# Patient Record
Sex: Female | Born: 1960
Health system: Southern US, Community
[De-identification: ages and names within clinical notes are randomized; demographics above are authoritative.]

## PROBLEM LIST (undated history)

## (undated) DIAGNOSIS — Z789 Other specified health status: Secondary | ICD-10-CM

## (undated) HISTORY — DX: Other specified health status: Z78.9

---

## 2004-09-11 ENCOUNTER — Ambulatory Visit: Payer: Self-pay | Admitting: General Surgery

## 2004-09-22 ENCOUNTER — Ambulatory Visit: Payer: Self-pay | Admitting: General Surgery

## 2004-11-18 ENCOUNTER — Ambulatory Visit: Payer: Self-pay | Admitting: General Practice

## 2005-11-17 ENCOUNTER — Ambulatory Visit: Payer: Self-pay | Admitting: General Practice

## 2006-11-23 ENCOUNTER — Ambulatory Visit: Payer: Self-pay | Admitting: General Practice

## 2006-11-26 ENCOUNTER — Ambulatory Visit: Payer: Self-pay | Admitting: General Practice

## 2007-07-04 ENCOUNTER — Ambulatory Visit: Payer: Self-pay | Admitting: General Surgery

## 2008-01-13 ENCOUNTER — Ambulatory Visit: Payer: Self-pay | Admitting: General Surgery

## 2008-12-12 ENCOUNTER — Ambulatory Visit: Payer: Self-pay | Admitting: General Practice

## 2009-12-17 ENCOUNTER — Ambulatory Visit: Payer: Self-pay | Admitting: General Surgery

## 2010-12-24 ENCOUNTER — Ambulatory Visit: Payer: Self-pay | Admitting: Unknown Physician Specialty

## 2011-12-30 ENCOUNTER — Ambulatory Visit: Payer: Self-pay | Admitting: Unknown Physician Specialty

## 2012-01-15 ENCOUNTER — Ambulatory Visit: Payer: Self-pay | Admitting: Unknown Physician Specialty

## 2013-01-03 ENCOUNTER — Ambulatory Visit: Payer: Self-pay | Admitting: General Practice

## 2013-07-13 ENCOUNTER — Ambulatory Visit: Payer: Self-pay | Admitting: Specialist

## 2014-01-10 ENCOUNTER — Ambulatory Visit: Payer: Self-pay | Admitting: Unknown Physician Specialty

## 2014-03-13 ENCOUNTER — Ambulatory Visit: Payer: Self-pay | Admitting: General Practice

## 2014-03-26 HISTORY — PX: SHOULDER ARTHROSCOPY WITH ROTATOR CUFF REPAIR: SHX5685

## 2015-01-15 ENCOUNTER — Ambulatory Visit: Payer: Self-pay | Admitting: General Practice

## 2015-07-02 ENCOUNTER — Other Ambulatory Visit: Payer: Self-pay | Admitting: Physician Assistant

## 2015-10-21 ENCOUNTER — Other Ambulatory Visit: Payer: Self-pay | Admitting: Physician Assistant

## 2015-10-22 ENCOUNTER — Other Ambulatory Visit: Payer: Self-pay | Admitting: Physician Assistant

## 2016-01-01 ENCOUNTER — Other Ambulatory Visit: Payer: Self-pay | Admitting: Family Medicine

## 2016-01-01 ENCOUNTER — Other Ambulatory Visit: Payer: Self-pay | Admitting: Unknown Physician Specialty

## 2016-01-01 DIAGNOSIS — Z1231 Encounter for screening mammogram for malignant neoplasm of breast: Secondary | ICD-10-CM

## 2016-01-22 ENCOUNTER — Ambulatory Visit
Admission: RE | Admit: 2016-01-22 | Discharge: 2016-01-22 | Disposition: A | Discharge status: Home / Self Care | Payer: BLUE CROSS/BLUE SHIELD | Source: Ambulatory Visit | Attending: Family Medicine | Admitting: Family Medicine

## 2016-01-22 DIAGNOSIS — R928 Other abnormal and inconclusive findings on diagnostic imaging of breast: Secondary | ICD-10-CM | POA: Diagnosis not present

## 2016-01-22 DIAGNOSIS — Z1231 Encounter for screening mammogram for malignant neoplasm of breast: Secondary | ICD-10-CM

## 2016-01-27 ENCOUNTER — Other Ambulatory Visit: Payer: Self-pay | Admitting: Family Medicine

## 2016-01-27 DIAGNOSIS — N63 Unspecified lump in unspecified breast: Secondary | ICD-10-CM

## 2016-01-31 ENCOUNTER — Ambulatory Visit
Admission: RE | Admit: 2016-01-31 | Discharge: 2016-01-31 | Disposition: A | Discharge status: Home / Self Care | Payer: BLUE CROSS/BLUE SHIELD | Source: Ambulatory Visit | Attending: Family Medicine | Admitting: Family Medicine

## 2016-01-31 DIAGNOSIS — N63 Unspecified lump in unspecified breast: Secondary | ICD-10-CM

## 2016-01-31 DIAGNOSIS — N6001 Solitary cyst of right breast: Secondary | ICD-10-CM | POA: Diagnosis not present

## 2016-06-30 ENCOUNTER — Emergency Department: Payer: BLUE CROSS/BLUE SHIELD

## 2016-06-30 DIAGNOSIS — R0781 Pleurodynia: Secondary | ICD-10-CM | POA: Diagnosis not present

## 2016-06-30 DIAGNOSIS — S20212A Contusion of left front wall of thorax, initial encounter: Secondary | ICD-10-CM | POA: Diagnosis not present

## 2016-06-30 DIAGNOSIS — Y92002 Bathroom of unspecified non-institutional (private) residence single-family (private) house as the place of occurrence of the external cause: Secondary | ICD-10-CM | POA: Diagnosis not present

## 2016-06-30 DIAGNOSIS — Y999 Unspecified external cause status: Secondary | ICD-10-CM | POA: Diagnosis not present

## 2016-06-30 DIAGNOSIS — Y939 Activity, unspecified: Secondary | ICD-10-CM | POA: Insufficient documentation

## 2016-06-30 DIAGNOSIS — S299XXA Unspecified injury of thorax, initial encounter: Secondary | ICD-10-CM | POA: Diagnosis not present

## 2016-06-30 DIAGNOSIS — W01198A Fall on same level from slipping, tripping and stumbling with subsequent striking against other object, initial encounter: Secondary | ICD-10-CM | POA: Insufficient documentation

## 2016-06-30 NOTE — ED Triage Notes (Signed)
Pt to triage via w/c with no distress noted; reports falling in bathtub PTA, hitting left side ribcage; st pain increases with deep breathing; denies any other c/o or injuries

## 2016-07-01 ENCOUNTER — Emergency Department
Admission: EM | Admit: 2016-07-01 | Discharge: 2016-07-01 | Disposition: A | Discharge status: Home / Self Care | Payer: BLUE CROSS/BLUE SHIELD | Attending: Emergency Medicine | Admitting: Emergency Medicine

## 2016-07-01 DIAGNOSIS — S20212A Contusion of left front wall of thorax, initial encounter: Secondary | ICD-10-CM

## 2016-07-01 DIAGNOSIS — W19XXXA Unspecified fall, initial encounter: Secondary | ICD-10-CM

## 2016-07-01 MED ORDER — KETOROLAC TROMETHAMINE 30 MG/ML IJ SOLN
60.0000 mg | Freq: Once | INTRAMUSCULAR | Status: AC
  Administered 2016-07-01: 60 mg via INTRAMUSCULAR
  Filled 2016-07-01: qty 2

## 2016-07-01 MED ORDER — IBUPROFEN 600 MG PO TABS: 600.0000 mg | ORAL_TABLET | Freq: Three times a day (TID) | ORAL | 0 refills | Status: DC | PRN

## 2016-07-01 MED ORDER — OXYCODONE-ACETAMINOPHEN 5-325 MG PO TABS
1.0000 | ORAL_TABLET | Freq: Once | ORAL | Status: AC
  Administered 2016-07-01: 1 via ORAL
  Filled 2016-07-01: qty 1

## 2016-07-01 MED ORDER — ONDANSETRON 4 MG PO TBDP
4.0000 mg | ORAL_TABLET | Freq: Once | ORAL | Status: AC
  Administered 2016-07-01: 4 mg via ORAL
  Filled 2016-07-01: qty 1

## 2016-07-01 MED ORDER — OXYCODONE-ACETAMINOPHEN 5-325 MG PO TABS
1.0000 | ORAL_TABLET | ORAL | 0 refills | Status: DC | PRN
Start: 2016-07-01 — End: 2017-05-20

## 2016-07-01 NOTE — Discharge Instructions (Signed)
1. You may take pain medicines as needed (Motrin/Percocet). 2. Apply ice to affected area several times daily. 3. Use incentive spirometer as instructed. 4. Return to the ER for worsening symptoms, persistent vomiting, fever, difficulty breathing or other concerns.

## 2016-07-01 NOTE — ED Provider Notes (Signed)
[facility] Emergency Department Provider Note   ____________________________________________   First MD Initiated Contact with Patient 07/01/16 [phone]     (approximate)  I have reviewed the triage vital signs and the nursing notes.   HISTORY  Chief Complaint Fall    HPI Jenna Ross is a 55 y.o. female who presents to the ED from home with a chief complaint of fall with left rib pain. Patient tripped on her bath mat and fell, striking her posterior left ribs into bathtub. States she spun around and struck her shin. Denies striking head or LOC. Denies associated neck pain, headache, vision changes, shortness of breath, abdominal pain, hematuria, nausea, vomiting, diarrhea. Reports it hurts to take a deep breath. Nothing makes her symptoms better.   Past medical history None  There are no active problems to display for this patient.   No past surgical history on file.  Prior to Admission medications   Medication Sig Start Date End Date Taking? Authorizing Provider  ibuprofen (ADVIL,MOTRIN) 600 MG tablet Take 1 tablet (600 mg total) by mouth every 8 (eight) hours as needed. 07/01/16   Paulette Blanch, MD  oxyCODONE-acetaminophen (ROXICET) 5-325 MG tablet Take 1 tablet by mouth every 4 (four) hours as needed for severe pain. 07/01/16   Paulette Blanch, MD    Allergies Review of patient's allergies indicates no known allergies.  No family history on file.  Social History Social History  Substance Use Topics  . Smoking status: Not on file  . Smokeless tobacco: Not on file  . Alcohol use Not on file    Review of Systems  Constitutional: No fever/chills. Eyes: No visual changes. ENT: No sore throat. Cardiovascular: Positive for chest pain. Respiratory: Positive for pain on inspiration. Denies shortness of breath. Gastrointestinal: No abdominal pain.  No nausea, no vomiting.  No diarrhea.  No constipation. Genitourinary: Negative for  dysuria. Musculoskeletal: Negative for back pain. Skin: Negative for rash. Neurological: Negative for headaches, focal weakness or numbness.  10-point ROS otherwise negative.  ____________________________________________   PHYSICAL EXAM:  VITAL SIGNS: ED Triage Vitals  Enc Vitals Group     BP 07/01/16 0043 135/72     Pulse Rate 07/01/16 0043 67     Resp 07/01/16 0043 18     Temp 07/01/16 0043 97.8 F (36.6 C)     Temp Source 07/01/16 0043 Oral     SpO2 07/01/16 0043 100 %     Weight 06/30/16 2337 140 lb (63.5 kg)     Height 06/30/16 2337 5\' 1"  (1.549 m)     Head Circumference --      Peak Flow --      Pain Score 06/30/16 2338 10     Pain Loc --      Pain Edu? --      Excl. in [place]? --     Constitutional: Alert and oriented. Well appearing and in mild acute distress. Eyes: Conjunctivae are normal. PERRL. EOMI. Head: Atraumatic. Nose: No congestion/rhinnorhea. Mouth/Throat: Mucous membranes are moist.  Oropharynx non-erythematous. Neck: No stridor.  No cervical spine tenderness to palpation. Cardiovascular: Normal rate, regular rhythm. Grossly normal heart sounds.  Good peripheral circulation. Respiratory: Normal respiratory effort.  No retractions. Splinting. Lungs CTAB. Left lateral/posterior lower ribs with ecchymosis and tender to palpation. No crepitus. Gastrointestinal: Soft and nontender to light or deep palpation, especially in the left upper quadrant.. No distention. No abdominal bruits. No CVA tenderness. Musculoskeletal: No lower extremity tenderness nor edema.  No joint effusions. Neurologic:  Normal speech and language. No gross focal neurologic deficits are appreciated. No gait instability. Skin:  Skin is warm, dry and intact. No rash noted. Psychiatric: Mood and affect are normal. Speech and behavior are normal.  ____________________________________________   LABS (all labs ordered are listed, but only abnormal results are displayed)  Labs Reviewed - No  data to display ____________________________________________  EKG  None ____________________________________________  RADIOLOGY  Chest 2 view (viewed by me, interpreted per Dr. Quintella Reichert): No active cardiopulmonary disease. ____________________________________________   PROCEDURES  Procedure(s) performed: None  Procedures  Critical Care performed: No  ____________________________________________   INITIAL IMPRESSION / ASSESSMENT AND PLAN / ED COURSE  Pertinent labs & imaging results that were available during my care of the patient were reviewed by me and considered in my medical decision making (see chart for details).  55 year old female who presents status post fall with left rib contusion. Patient is not tachypneic, room air saturations 100%. Will treat with NSAID, analgesia, incentive spirometer with close follow-up with patient's PCP. Strict return precautions given. Patient and spouse verbalize understanding and agree with plan of care.  Clinical Course     ____________________________________________   FINAL CLINICAL IMPRESSION(S) / ED DIAGNOSES  Final diagnoses:  Fall, initial encounter  Chest wall contusion, left, initial encounter  Bruised rib, left, initial encounter      NEW MEDICATIONS STARTED DURING THIS VISIT:  Discharge Medication List as of 07/01/2016  3:23 AM    START taking these medications   Details  ibuprofen (ADVIL,MOTRIN) 600 MG tablet Take 1 tablet (600 mg total) by mouth every 8 (eight) hours as needed., Starting Wed 07/01/2016, Print    oxyCODONE-acetaminophen (ROXICET) 5-325 MG tablet Take 1 tablet by mouth every 4 (four) hours as needed for severe pain., Starting Wed 07/01/2016, Print         Note:  This document was prepared using Dragon voice recognition software and may include unintentional dictation errors.    Paulette Blanch, MD 07/01/16 [phone]

## 2016-10-26 HISTORY — PX: BREAST CYST ASPIRATION: SHX578

## 2017-02-01 ENCOUNTER — Other Ambulatory Visit: Payer: Self-pay | Admitting: Family Medicine

## 2017-02-01 DIAGNOSIS — Z1231 Encounter for screening mammogram for malignant neoplasm of breast: Secondary | ICD-10-CM

## 2017-02-26 ENCOUNTER — Encounter: Payer: Self-pay | Admitting: Radiology

## 2017-02-26 ENCOUNTER — Ambulatory Visit
Admission: RE | Admit: 2017-02-26 | Discharge: 2017-02-26 | Disposition: A | Discharge status: Home / Self Care | Payer: BLUE CROSS/BLUE SHIELD | Source: Ambulatory Visit | Attending: Family Medicine | Admitting: Family Medicine

## 2017-02-26 DIAGNOSIS — Z1231 Encounter for screening mammogram for malignant neoplasm of breast: Secondary | ICD-10-CM | POA: Diagnosis not present

## 2017-03-23 ENCOUNTER — Encounter: Payer: Self-pay | Admitting: *Deleted

## 2017-03-31 ENCOUNTER — Encounter: Payer: Self-pay | Admitting: *Deleted

## 2017-04-07 ENCOUNTER — Ambulatory Visit (INDEPENDENT_AMBULATORY_CARE_PROVIDER_SITE_OTHER): Payer: BLUE CROSS/BLUE SHIELD | Admitting: General Surgery

## 2017-04-07 ENCOUNTER — Encounter: Payer: Self-pay | Admitting: General Surgery

## 2017-04-07 ENCOUNTER — Inpatient Hospital Stay: Payer: Self-pay

## 2017-04-07 VITALS — BP 124/72 | HR 70 | Resp 12 | Ht 62.0 in | Wt 143.0 lb

## 2017-04-07 DIAGNOSIS — N6001 Solitary cyst of right breast: Secondary | ICD-10-CM | POA: Diagnosis not present

## 2017-04-07 DIAGNOSIS — N6310 Unspecified lump in the right breast, unspecified quadrant: Secondary | ICD-10-CM

## 2017-04-07 NOTE — Progress Notes (Signed)
Patient ID: Jenna Ross, female   DOB: 08-Mar-1961, 56 y.o.   MRN: [id number]  Chief Complaint  Patient presents with  . Breast Mass    Right    HPI Jenna Ross is a 56 y.o. female who presents for a breast evaluation. The most recent mammogram was done on 03/01/2017. Patient noticed a lump in her right breast about two moths ago. The states the area was the size of a egg. A sharp pain come and go. Patient states the area has got smaller. She has been having Irregular periods and lots of hot flashes.  Patient does perform regular self breast checks and gets regular mammograms done.    HPI  No past medical history on file.  Past Surgical History:  Procedure Laterality Date  . CESAREAN SECTION  1986  . SHOULDER ARTHROSCOPY WITH ROTATOR CUFF REPAIR  03/2014    Family History  Problem Relation Age of Onset  . Breast cancer Neg Hx     Social History Social History  Substance Use Topics  . Smoking status: Former Smoker    Packs/day: 1.00    Years: 30.00  . Smokeless tobacco: Never Used  . Alcohol use No    No Known Allergies  Current Outpatient Prescriptions  Medication Sig Dispense Refill  . danazol (DANOCRINE) 100 MG capsule TAKE ONE CAPSULE BY MOUTH TWICE A DAY FOR 1 WEEK PER MONTH  3  . hydrOXYzine (ATARAX/VISTARIL) 25 MG tablet Take 25 mg by mouth every 8 (eight) hours as needed.  2  . ibuprofen (ADVIL,MOTRIN) 600 MG tablet Take 1 tablet (600 mg total) by mouth every 8 (eight) hours as needed. 15 tablet 0  . oxyCODONE-acetaminophen (ROXICET) 5-325 MG tablet Take 1 tablet by mouth every 4 (four) hours as needed for severe pain. (Patient taking differently: Take 1 tablet by mouth 3 times/day as needed-between meals & bedtime for severe pain. ) 20 tablet 0  . Phendimetrazine Tartrate 35 MG TABS TAKE 1 TABLET 3 TIMES A DAY (1 HOUR BEFORE MEALS)  0   No current facility-administered medications for this visit.     Review of Systems Review of Systems  Blood pressure  124/72, pulse 70, resp. rate 12, height 5\' 2"  (1.575 m), weight 143 lb (64.9 kg), last menstrual period 02/18/2017.  Physical Exam Physical Exam  Constitutional: She is oriented to person, place, and time. She appears well-developed and well-nourished.  Eyes: Conjunctivae are normal. No scleral icterus.  Neck: Neck supple.  Cardiovascular: Normal rate, regular rhythm and normal heart sounds.   Pulmonary/Chest: Effort normal and breath sounds normal. Right breast exhibits mass (Right breast mass at 10 o'clock 6 mm from  nipple 3 by 4 mass and 1 cm mass below other mass ). Right breast exhibits no inverted nipple, no nipple discharge, no skin change and no tenderness. Left breast exhibits no inverted nipple, no mass, no nipple discharge, no skin change and no tenderness.  Lymphadenopathy:    She has no cervical adenopathy.    She has no axillary adenopathy.  Neurological: She is alert and oriented to person, place, and time.  Skin: Skin is warm and dry.  Performed FNA aspiration   Data Reviewed Screening mammograms dated 03/01/2017 were reviewed. Report describes a limited exam based on the patient's inability to tolerate adequate compression. BI-RADS-1.  Ultrasound examination of the right breast in the upper outer quadrant where dominant nodularity was appreciated showed the largest lesion at the 10:00 position, 7 cm from the nipple  measuring 1.8 x 2.7 x 2.8 cm. This was an anechoic lesion with strong posterior acoustic enhancement. At the 9:00 position, 7 cm from the nipple a 0.8 x 1.2 x 1.3 cm simple cyst is identified.  Due to local discomfort the patient desired aspiration. The skin was cleansed with alcohol and 1 mL of 1% plain Xylocaine was utilized. From an inferior approach a 22-gauge needle was placed into the lesion with complete resolution. The clear fluid was discarded. BI-RADS-2.  Assessment    Large, symptomatic breast cyst. Resolved.    Plan    The area may recur based on  its size, and a 1 month follow-up to reassess has been recommended.    Patient to return in one month. The patient is aware to call back for any questions or concerns.    HPI, Physical Exam, Assessment and Plan have been scribed under the direction and in the presence of Hervey Ard, MD.  Jenna Ross, CMA  I have completed the exam and reviewed the above documentation for accuracy and completeness.  I agree with the above.  Haematologist has been used and any errors in dictation or transcription are unintentional.  Hervey Ard, M.D., F.A.C.S.  Jenna Ross 04/09/2017, 7:09 AM

## 2017-04-07 NOTE — Patient Instructions (Signed)
Return in one month.  

## 2017-04-09 DIAGNOSIS — N6001 Solitary cyst of right breast: Secondary | ICD-10-CM | POA: Insufficient documentation

## 2017-05-18 ENCOUNTER — Ambulatory Visit: Payer: BLUE CROSS/BLUE SHIELD | Admitting: General Surgery

## 2017-05-20 ENCOUNTER — Encounter: Payer: Self-pay | Admitting: General Surgery

## 2017-05-20 ENCOUNTER — Ambulatory Visit (INDEPENDENT_AMBULATORY_CARE_PROVIDER_SITE_OTHER): Payer: BLUE CROSS/BLUE SHIELD | Admitting: General Surgery

## 2017-05-20 VITALS — BP 102/64 | HR 74 | Resp 12 | Ht 61.5 in | Wt 145.0 lb

## 2017-05-20 DIAGNOSIS — N6001 Solitary cyst of right breast: Secondary | ICD-10-CM

## 2017-05-20 NOTE — Progress Notes (Addendum)
Patient ID: Jenna Ross, female   DOB: 12/08/1960, 56 y.o.   MRN: [id number]  Chief Complaint  Patient presents with  . Follow-up    HPI Jenna Ross is a 56 y.o. female.  Here for follow up right breast cyst. She states she is much better, no further complaints.  HPI  No past medical history on file.  Past Surgical History:  Procedure Laterality Date  . CESAREAN SECTION  1986  . SHOULDER ARTHROSCOPY WITH ROTATOR CUFF REPAIR  03/2014    Family History  Problem Relation Age of Onset  . Breast cancer Neg Hx     Social History Social History  Substance Use Topics  . Smoking status: Former Smoker    Packs/day: 1.00    Years: 30.00  . Smokeless tobacco: Never Used  . Alcohol use No    No Known Allergies  Current Outpatient Prescriptions  Medication Sig Dispense Refill  . danazol (DANOCRINE) 100 MG capsule TAKE ONE CAPSULE BY MOUTH TWICE A DAY FOR 1 WEEK PER MONTH  3  . hydrOXYzine (ATARAX/VISTARIL) 25 MG tablet Take 25 mg by mouth every 8 (eight) hours as needed.  2  . ibuprofen (ADVIL,MOTRIN) 600 MG tablet Take 1 tablet (600 mg total) by mouth every 8 (eight) hours as needed. 15 tablet 0  . Phendimetrazine Tartrate 35 MG TABS TAKE 1 TABLET 3 TIMES A DAY (1 HOUR BEFORE MEALS)  0   No current facility-administered medications for this visit.     Review of Systems Review of Systems  Constitutional: Negative.   Respiratory: Negative.   Cardiovascular: Negative.     Blood pressure 102/64, pulse 74, resp. rate 12, height 5' 1.5" (1.562 m), weight 145 lb (65.8 kg).  Physical Exam Physical Exam  Constitutional: She is oriented to person, place, and time. She appears well-developed and well-nourished.  Pulmonary/Chest: Right breast exhibits mass. Right breast exhibits no inverted nipple, no nipple discharge, no skin change and no tenderness.    1.5 cm right breast cyst 9 o'clock 7 CFN.  Neurological: She is alert and oriented to person, place, and time.  Skin:  Skin is warm and dry.  Psychiatric: Her behavior is normal.    Data Reviewed Focused ultrasound of the upper outer quadrant of the showed the dominant lesion aspirated at the time of her 04/07/2017 exam has not recurred.  The previously identified simple cyst at the 9:00 position shows a scant interval change from 1.3-1.5 cm. BI-RADS-2. ( No images, no charge).   Assessment    Benign breast exam.    Plan    Patient will report if she develops any new symptomatic lesions or appreciate any other changes in her breast exam.    Follow up as needed.  HPI, Physical Exam, Assessment and Plan have been scribed under the direction and in the presence of Robert Bellow, MD. Karie Fetch, RN  I have completed the exam and reviewed the above documentation for accuracy and completeness.  I agree with the above.  Haematologist has been used and any errors in dictation or transcription are unintentional.  Hervey Ard, M.D., F.A.C.S.  Robert Bellow 05/20/2017, 9:47 PM

## 2017-05-20 NOTE — Patient Instructions (Signed)
The patient is aware to call back for any questions or concerns.  

## 2017-08-05 DIAGNOSIS — H5213 Myopia, bilateral: Secondary | ICD-10-CM | POA: Diagnosis not present

## 2017-12-27 ENCOUNTER — Ambulatory Visit (INDEPENDENT_AMBULATORY_CARE_PROVIDER_SITE_OTHER): Payer: BLUE CROSS/BLUE SHIELD | Admitting: Obstetrics and Gynecology

## 2017-12-27 ENCOUNTER — Encounter: Payer: Self-pay | Admitting: Obstetrics and Gynecology

## 2017-12-27 VITALS — BP 102/62 | HR 67 | Ht 61.5 in | Wt 138.0 lb

## 2017-12-27 DIAGNOSIS — Z1231 Encounter for screening mammogram for malignant neoplasm of breast: Secondary | ICD-10-CM

## 2017-12-27 DIAGNOSIS — Z124 Encounter for screening for malignant neoplasm of cervix: Secondary | ICD-10-CM

## 2017-12-27 DIAGNOSIS — M359 Systemic involvement of connective tissue, unspecified: Secondary | ICD-10-CM | POA: Diagnosis not present

## 2017-12-27 DIAGNOSIS — Z0000 Encounter for general adult medical examination without abnormal findings: Secondary | ICD-10-CM

## 2017-12-27 DIAGNOSIS — Z1239 Encounter for other screening for malignant neoplasm of breast: Secondary | ICD-10-CM

## 2017-12-27 DIAGNOSIS — L308 Other specified dermatitis: Secondary | ICD-10-CM

## 2017-12-27 DIAGNOSIS — L988 Other specified disorders of the skin and subcutaneous tissue: Secondary | ICD-10-CM | POA: Insufficient documentation

## 2017-12-27 NOTE — Progress Notes (Signed)
Gynecology Annual Exam  PCP: Karen Kitchens, MD  Chief Complaint:  Chief Complaint  Patient presents with  . Gynecologic Exam    History of Present Illness:Patient is a 57 y.o. G1P1001 presents for annual exam. 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 She is considering hysterectomy, but if she is entering menopause she does not want to have to procedure. Patient has been treated by a dermatologist for this condition. She was responding to therapies, but now feels like the symptoms are not responding. She has read that a hysterectomy can treat this condition but she might enter menopause now and she does not want an unnecessary surgery.   LMP: Patient's last menstrual period was 10/22/2017 (exact date). Menarche:not applicable Average Interval: irregular, not applicable days Duration of flow: several days Heavy Menses: no Clots: no Intermenstrual Bleeding: no Postcoital Bleeding: no Dysmenorrhea: no  The patient is sexually active. She denies dyspareunia.  The patient does perform self breast exams.  There is no notable family history of breast or ovarian cancer in her family.  The patient wears seatbelts: yes.   The patient has regular exercise: yes.    The patient denies current symptoms of depression.     Review of Systems: Review of Systems  Constitutional: Negative for chills, fever, malaise/fatigue and weight loss.  HENT: Negative for congestion, hearing loss and sinus pain.   Eyes: Negative for blurred vision and double vision.  Respiratory: Negative for cough, sputum production, shortness of breath and wheezing.   Cardiovascular: Negative for chest pain, palpitations, orthopnea and leg swelling.  Gastrointestinal: Negative for abdominal pain, constipation, diarrhea, nausea and vomiting.  Genitourinary: Negative for dysuria, flank pain, frequency, hematuria and urgency.  Musculoskeletal: Negative for back pain, falls and joint pain.   Skin: Negative for itching and rash.  Neurological: Negative for dizziness and headaches.  Psychiatric/Behavioral: Negative for depression, substance abuse and suicidal ideas. The patient is not nervous/anxious.     Past Medical History:  Past Medical History:  Diagnosis Date  . No known health problems     Past Surgical History:  Past Surgical History:  Procedure Laterality Date  . CESAREAN SECTION  1986  . SHOULDER ARTHROSCOPY WITH ROTATOR CUFF REPAIR  03/2014    Gynecologic History:  Patient's last menstrual period was 10/22/2017 (exact date). Last Pap: Results were: 2016 NIL and HR HPV negative  Last mammogram: 2018 Results were: BI-RAD I  Obstetric History: G1P1001  Family History:  Family History  Problem Relation Age of Onset  . Hypertension Mother   . Diabetes Mother   . Heart disease Mother   . Hypertension Father   . Heart disease Father   . Diabetes Maternal Grandmother   . Leukemia Maternal Grandfather   . Breast cancer Neg Hx     Social History:  Social History   Socioeconomic History  . Marital status: Married    Spouse name: Not on file  . Number of children: Not on file  . Years of education: Not on file  . Highest education level: Not on file  Social Needs  . Financial resource strain: Not on file  . Food insecurity - worry: Not on file  . Food insecurity - inability: Not on file  . Transportation needs - medical: Not on file  . Transportation needs - non-medical: Not on file  Occupational History  . Not on file  Tobacco Use  . Smoking status: Former Smoker    Packs/day: 1.00  Years: 30.00    Pack years: 30.00  . Smokeless tobacco: Never Used  Substance and Sexual Activity  . Alcohol use: No  . Drug use: No  . Sexual activity: Yes    Birth control/protection: Post-menopausal  Other Topics Concern  . Not on file  Social History Narrative  . Not on file    Allergies:  No Known Allergies  Medications: Prior to Admission  medications   Medication Sig Start Date End Date Taking? Authorizing Provider  danazol (DANOCRINE) 100 MG capsule TAKE ONE CAPSULE BY MOUTH TWICE A DAY FOR 1 WEEK PER MONTH 03/15/17  Yes [provider]  hydrOXYzine (ATARAX/VISTARIL) 25 MG tablet Take 25 mg by mouth every 8 (eight) hours as needed. 03/15/17  Yes [provider]  ibuprofen (ADVIL,MOTRIN) 600 MG tablet Take 1 tablet (600 mg total) by mouth every 8 (eight) hours as needed. 07/01/16  Yes Paulette Blanch, MD  Phendimetrazine Tartrate 35 MG TABS TAKE 1 TABLET 3 TIMES A DAY (1 HOUR BEFORE MEALS) 03/27/17   [provider]    Physical Exam Vitals: Blood pressure 102/62, pulse 67, height 5' 1.5" (1.562 m), weight 138 lb (62.6 kg), last menstrual period 10/22/2017.  General: NAD HEENT: normocephalic, anicteric Thyroid: no enlargement, no palpable nodules Pulmonary: No increased work of breathing, CTAB Cardiovascular: RRR, distal pulses 2+ Breast: Breast symmetrical, no tenderness, no palpable nodules or masses, no skin or nipple retraction present, no nipple discharge.  No axillary or supraclavicular lymphadenopathy. Abdomen: NABS, soft, non-tender, non-distended.  Umbilicus without lesions.  No hepatomegaly, splenomegaly or masses palpable. No evidence of hernia  Genitourinary:  External: Normal external female genitalia.  Normal urethral meatus, normal  Bartholin's and Skene's glands.    Vagina: Normal vaginal mucosa, no evidence of prolapse.    Cervix: Grossly normal in appearance, no bleeding  Uterus: Non-enlarged, mobile, normal contour.  No CMT  Adnexa: ovaries non-enlarged, no adnexal masses  Rectal: deferred  Lymphatic: no evidence of inguinal lymphadenopathy Extremities: no edema, erythema, or tenderness Neurologic: Grossly intact Psychiatric: mood appropriate, affect full  Female chaperone present for pelvic and breast  portions of the physical exam     Assessment: 57 y.o. G1P1001 routine annual  exam  Plan: Problem List Items Addressed This Visit    None      1) Mammogram - recommend yearly screening mammogram.  Mammogram Was ordered today  2) STI screening  was offered and declined  3) ASCCP guidelines and rational discussed.  Patient opts for every 3 years screening interval  4) Osteoporosis  - per USPTF routine screening DEXA at age 40 - FRAX 79 year major fracture risk 4.8%,  10 year hip fracture risk 0.5%  Consider FDA-approved medical therapies in postmenopausal women and men aged 3 years and older, based on the following: a) A hip or vertebral (clinical or morphometric) fracture b) T-score ? -2.5 at the femoral neck or spine after appropriate evaluation to exclude secondary causes C) Low bone mass (T-score between -1.0 and -2.5 at the femoral neck or spine) and a 10-year probability of a hip fracture ? 3% or a 10-year probability of a major osteoporosis-related fracture ? 20% based on the [place]-adapted WHO algorithm   5) Routine healthcare maintenance including cholesterol, diabetes screening discussed managed by PCP  6) Colonoscopy offered and declined. Patient wants to follow up with her PCP about obtaining a referral for a colonoscopy.  Screening recommended starting at age 99 for average risk individuals, age 16 for individuals  deemed at increased risk (including African Americans) and recommended to continue until age 57.  For patient age 93-85 individualized approach is recommended.  Gold standard screening is via colonoscopy, Cologuard screening is an acceptable alternative for patient unwilling or unable to undergo colonoscopy.  "Colorectal cancer screening for average?risk adults: 2018 guideline update from the American Cancer Society"[place]: A Cancer Journal for Clinicians: Mar 24, 2017   7) Follow up if patient decides that she would like a hysterectomy.    Adrian Prows MD Mosetta Pigeon, [facility] 12/27/2017, 9:52 AM

## 2017-12-29 LAB — PAPIG, HPV, RFX 16/18
HPV, high-risk: NEGATIVE
PAP Smear Comment: 0

## 2017-12-29 NOTE — Progress Notes (Signed)
Please call patient with negative pap smear result. She will need her next pap in 5 years. Thank you! Dr. Gilman Schmidt

## 2018-02-02 ENCOUNTER — Ambulatory Visit
Admission: RE | Admit: 2018-02-02 | Discharge: 2018-02-02 | Disposition: A | Discharge status: Home / Self Care | Payer: BLUE CROSS/BLUE SHIELD | Source: Ambulatory Visit | Attending: Obstetrics and Gynecology | Admitting: Obstetrics and Gynecology

## 2018-02-02 DIAGNOSIS — Z1231 Encounter for screening mammogram for malignant neoplasm of breast: Secondary | ICD-10-CM | POA: Diagnosis not present

## 2018-02-02 DIAGNOSIS — Z1239 Encounter for other screening for malignant neoplasm of breast: Secondary | ICD-10-CM

## 2018-02-04 ENCOUNTER — Other Ambulatory Visit: Payer: Self-pay | Admitting: Obstetrics and Gynecology

## 2018-02-04 DIAGNOSIS — R928 Other abnormal and inconclusive findings on diagnostic imaging of breast: Secondary | ICD-10-CM

## 2018-02-04 DIAGNOSIS — N6489 Other specified disorders of breast: Secondary | ICD-10-CM

## 2018-02-08 NOTE — Progress Notes (Signed)
Please call and make sure patient is going for her additional imaging. Thank you, Dr. Gilman Schmidt

## 2018-02-11 ENCOUNTER — Ambulatory Visit
Admission: RE | Admit: 2018-02-11 | Discharge: 2018-02-11 | Disposition: A | Discharge status: Home / Self Care | Payer: BLUE CROSS/BLUE SHIELD | Source: Ambulatory Visit | Attending: Obstetrics and Gynecology | Admitting: Obstetrics and Gynecology

## 2018-02-11 DIAGNOSIS — N6489 Other specified disorders of breast: Secondary | ICD-10-CM

## 2018-02-11 DIAGNOSIS — R922 Inconclusive mammogram: Secondary | ICD-10-CM | POA: Diagnosis not present

## 2018-02-11 DIAGNOSIS — R928 Other abnormal and inconclusive findings on diagnostic imaging of breast: Secondary | ICD-10-CM

## 2018-02-11 DIAGNOSIS — N6002 Solitary cyst of left breast: Secondary | ICD-10-CM | POA: Insufficient documentation

## 2018-06-05 IMAGING — MG MM DIGITAL SCREENING BILAT W/ TOMO W/ CAD
8 of 11 series · 9 of 27 positions shown · non-contrast
Comparison: Previous exam(s).

CLINICAL DATA: Screening.

EXAM:
DIGITAL SCREENING BILATERAL MAMMOGRAM WITH TOMO AND CAD

[L MLO (1 of 2)]
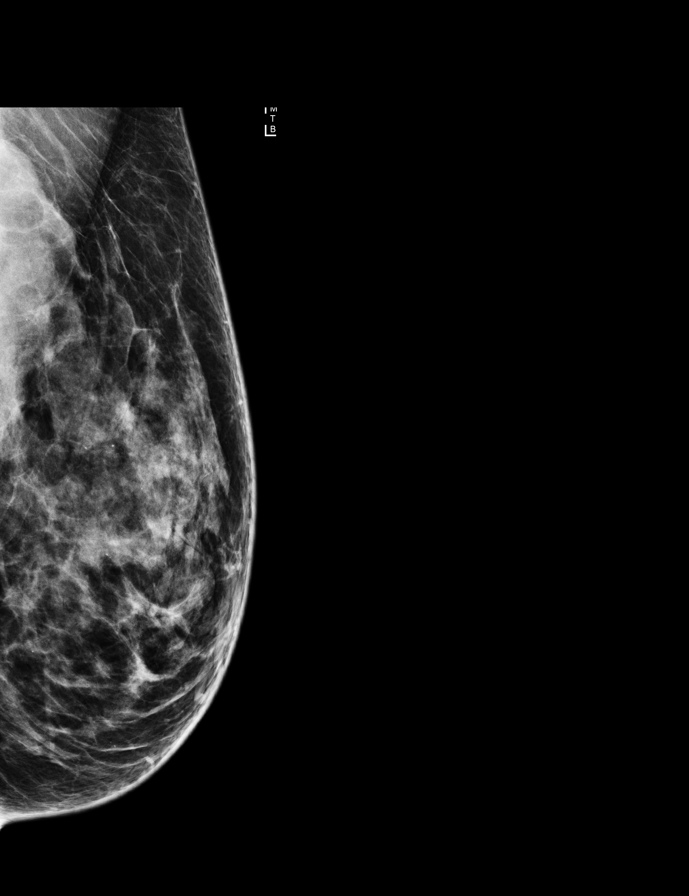

[L MLO synth-2D]
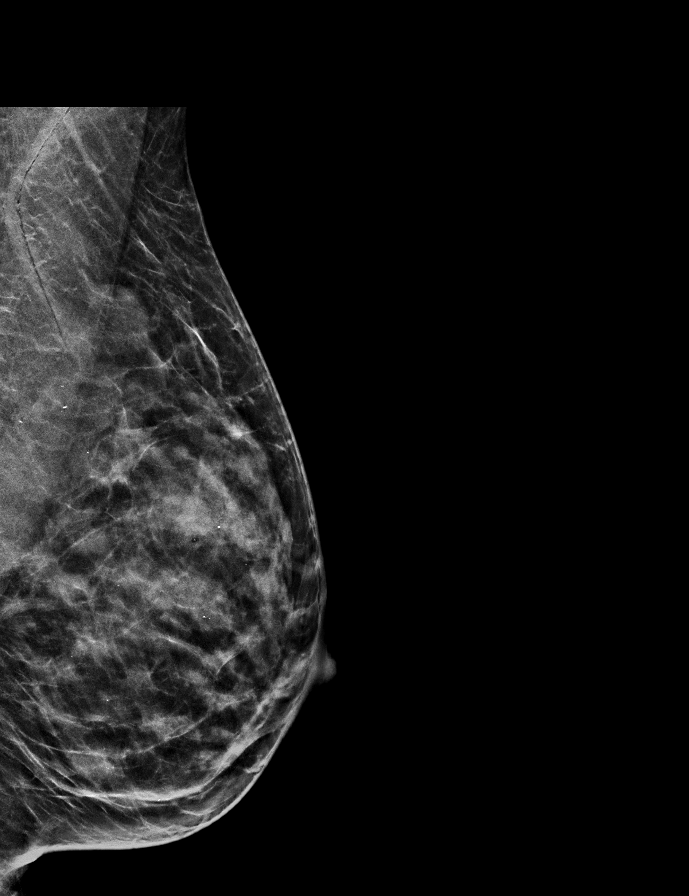

[R MLO]
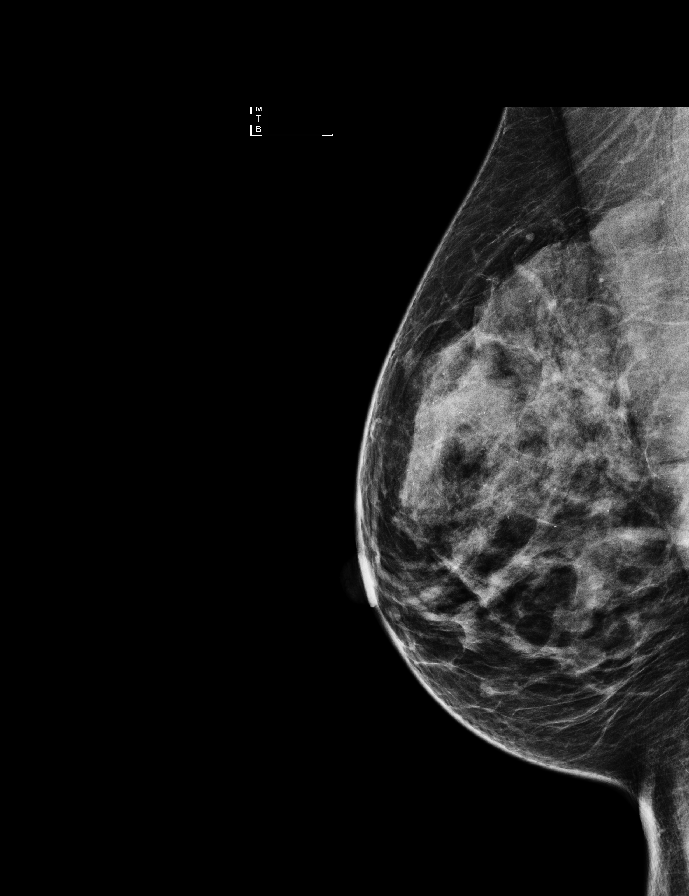

[L CC]
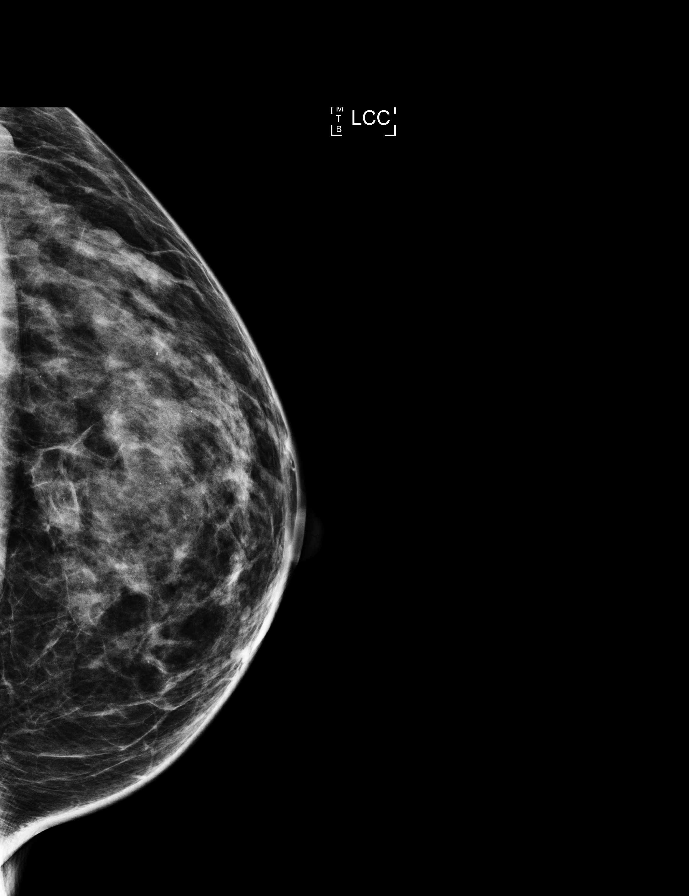

[R CC]
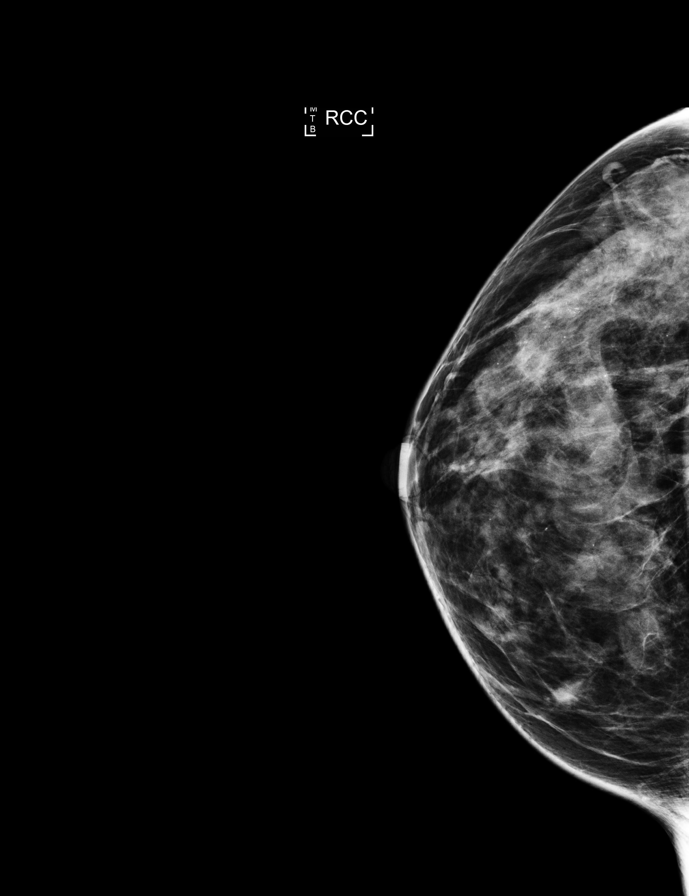

[L MLO (2 of 2)]
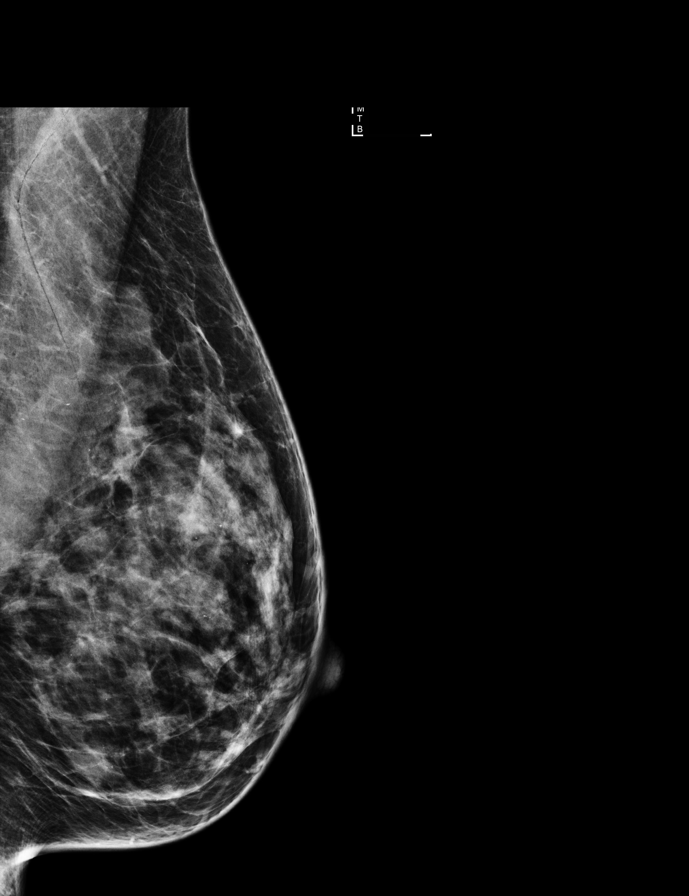

[R CC synth-2D]
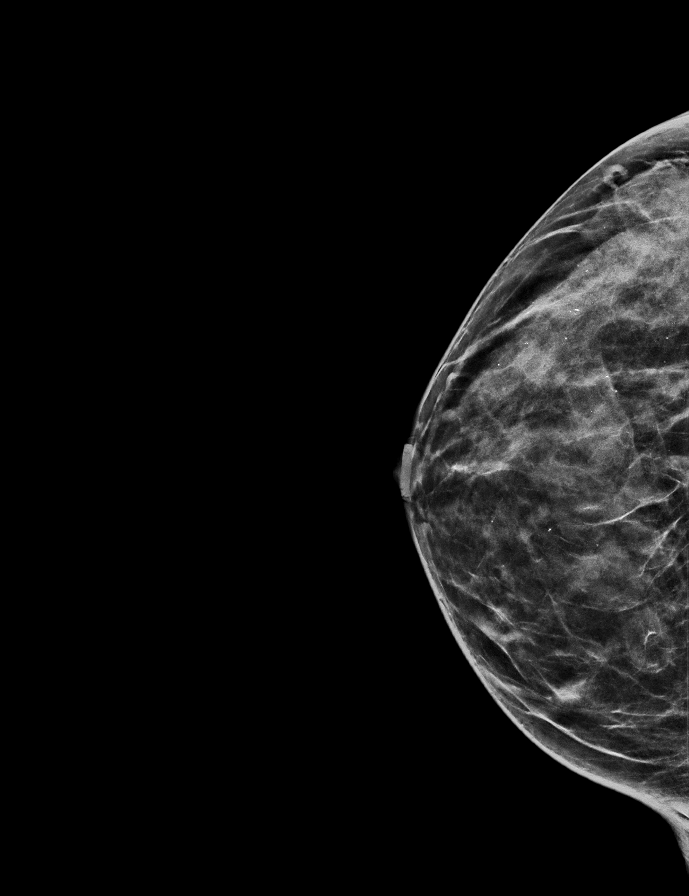

[R CC tomo · 2 of 50 frames shown]
[frame 17/50]
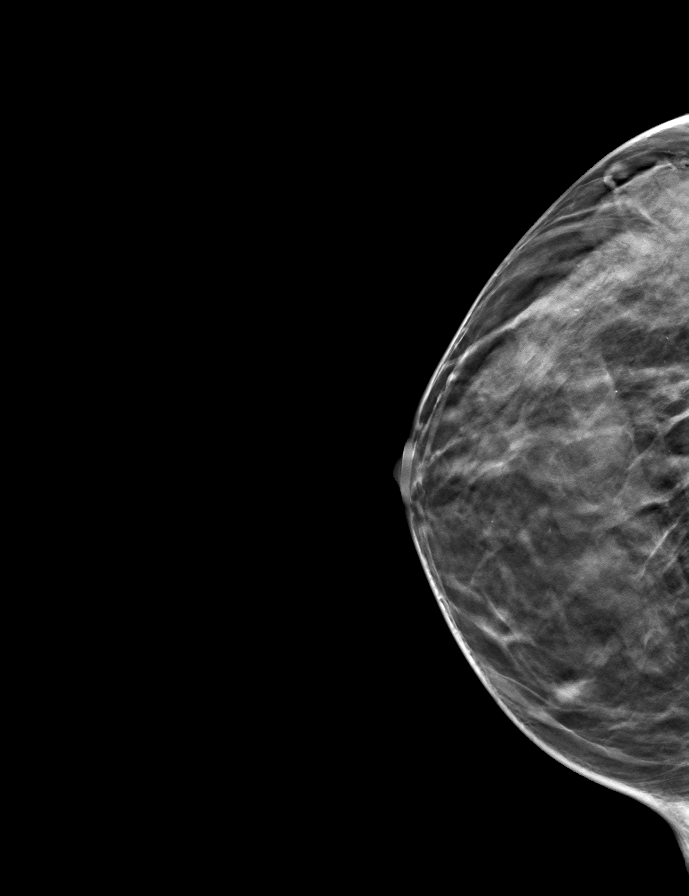
[frame 25/50]
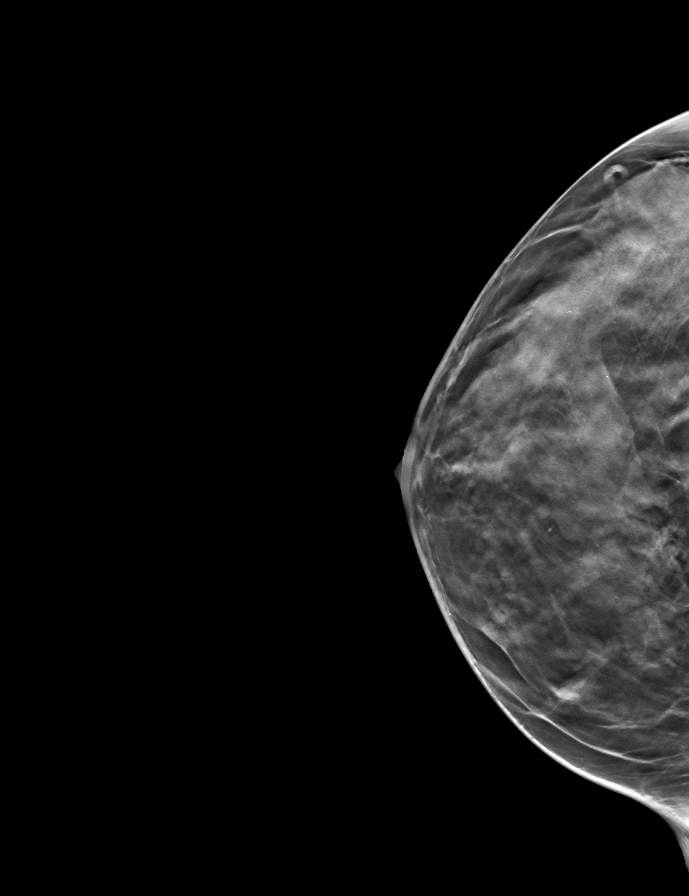

[9 of 27 positions shown; findings below may reference images not displayed]

ACR Breast Density Category c: The breast tissue is heterogeneously
dense, which may obscure small masses.
FINDINGS: In the left breast, a possible asymmetry warrants further
evaluation. In the right breast, no findings suspicious for
malignancy. Images were processed with CAD.
IMPRESSION: Further evaluation is suggested for possible asymmetry in the left
breast.

RECOMMENDATION:
Diagnostic mammogram and possibly ultrasound of the left breast.
(Code:[id number])

The patient will be contacted regarding the findings, and additional
imaging will be scheduled.

BI-RADS CATEGORY  0: Incomplete. Need additional imaging evaluation
and/or prior mammograms for comparison.

## 2019-01-10 ENCOUNTER — Other Ambulatory Visit: Payer: Self-pay | Admitting: Internal Medicine

## 2019-01-10 DIAGNOSIS — Z1231 Encounter for screening mammogram for malignant neoplasm of breast: Secondary | ICD-10-CM

## 2019-04-19 ENCOUNTER — Other Ambulatory Visit: Payer: Self-pay

## 2019-04-19 MED ORDER — HYDROXYZINE HCL 25 MG PO TABS: 25.0000 mg | ORAL_TABLET | Freq: Every evening | ORAL | 2 refills | Status: AC | PRN

## 2019-05-29 ENCOUNTER — Ambulatory Visit
Admission: RE | Admit: 2019-05-29 | Discharge: 2019-05-29 | Disposition: A | Discharge status: Home / Self Care | Payer: 59 | Source: Ambulatory Visit | Attending: Internal Medicine | Admitting: Internal Medicine

## 2019-05-29 DIAGNOSIS — Z1231 Encounter for screening mammogram for malignant neoplasm of breast: Secondary | ICD-10-CM | POA: Diagnosis not present

## 2019-06-13 ENCOUNTER — Telehealth: Payer: Self-pay

## 2019-06-13 NOTE — Telephone Encounter (Signed)
Pt would like note saying exercise is good for her so she can work out at [employer]. Pt states she doesn't have an chronic health conditions and doesn't need PT for any reason. She states that she got a letter saying if she has a doctor letter she can go to the gym.   I advised pt that generally that letter was meant for patients who needed to go to the gym for PT, but she still asked me to ask.

## 2019-06-13 NOTE — Telephone Encounter (Signed)
Please let patient know that I am a strong advocate for exercise and the importance of this for good health.  Noting this, with enclosed exercise areas currently closed for public health reasons in the setting of a pandemic, now is not the time to override this exception and write notes for her or others to do so. Please encourage patient to try to exercise outdoors with walking a good way as one example.   Hopefully gyms will be allowed to open soon.  Thanks,

## 2019-06-14 NOTE — Telephone Encounter (Signed)
Pt aware.

## 2019-12-04 ENCOUNTER — Other Ambulatory Visit: Payer: Self-pay

## 2019-12-04 DIAGNOSIS — R21 Rash and other nonspecific skin eruption: Secondary | ICD-10-CM

## 2019-12-04 MED ORDER — HYDROXYZINE HCL 25 MG PO TABS: 25.0000 mg | ORAL_TABLET | Freq: Every evening | ORAL | 0 refills | Status: DC | PRN

## 2019-12-27 NOTE — Progress Notes (Signed)
Patient comes in today for pre physical labs and EKG. Patient is scheduled with Randel Pigg, PA-C on 01/04/2020.

## 2019-12-28 ENCOUNTER — Other Ambulatory Visit: Payer: Self-pay

## 2019-12-28 ENCOUNTER — Ambulatory Visit: Payer: Self-pay

## 2019-12-28 DIAGNOSIS — R350 Frequency of micturition: Secondary | ICD-10-CM

## 2019-12-28 DIAGNOSIS — Z0000 Encounter for general adult medical examination without abnormal findings: Secondary | ICD-10-CM

## 2019-12-28 LAB — POCT URINALYSIS DIPSTICK
Bilirubin, UA: NEGATIVE
Blood, UA: NEGATIVE
Glucose, UA: NEGATIVE
Ketones, UA: NEGATIVE
Nitrite, UA: NEGATIVE
Protein, UA: NEGATIVE
Spec Grav, UA: 1.025 (ref 1.010–1.025)
Urobilinogen, UA: 0.2 E.U./dL
pH, UA: 5.5 (ref 5.0–8.0)

## 2019-12-29 LAB — CMP12+LP+TP+TSH+6AC+CBC/D/PLT
ALT: 14 IU/L (ref 0–32)
AST: 25 IU/L (ref 0–40)
Albumin/Globulin Ratio: 1.8 (ref 1.2–2.2)
Albumin: 4.6 g/dL (ref 3.8–4.9)
Alkaline Phosphatase: 75 IU/L (ref 39–117)
BUN/Creatinine Ratio: 11 (ref 9–23)
BUN: 10 mg/dL (ref 6–24)
Basophils Absolute: 0 10*3/uL (ref 0.0–0.2)
Basos: 1 %
Bilirubin Total: 0.8 mg/dL (ref 0.0–1.2)
Calcium: 9.5 mg/dL (ref 8.7–10.2)
Chloride: 102 mmol/L (ref 96–106)
Chol/HDL Ratio: 2.1 ratio (ref 0.0–4.4)
Cholesterol, Total: 186 mg/dL (ref 100–199)
Creatinine, Ser: 0.91 mg/dL (ref 0.57–1.00)
EOS (ABSOLUTE): 0 10*3/uL (ref 0.0–0.4)
Eos: 2 %
Estimated CHD Risk: <0.5 times avg. (ref 0.0–1.0)
Free Thyroxine Index: 1.4 (ref 1.2–4.9)
GFR calc Af Amer: 82 mL/min/{1.73_m2} (ref >59)
GFR calc non Af Amer: 71 mL/min/{1.73_m2} (ref >59)
GGT: 14 IU/L (ref 0–60)
Globulin, Total: 2.5 g/dL (ref 1.5–4.5)
Glucose: 76 mg/dL (ref 65–99)
HDL: 87 mg/dL (ref >39)
Hematocrit: 39.1 % (ref 34.0–46.6)
Hemoglobin: 12.9 g/dL (ref 11.1–15.9)
Immature Grans (Abs): 0 10*3/uL (ref 0.0–0.1)
Immature Granulocytes: 0 %
Iron: 98 ug/dL (ref 27–159)
LDH: 174 IU/L (ref 119–226)
LDL Chol Calc (NIH): 90 mg/dL (ref 0–99)
Lymphocytes Absolute: 1 10*3/uL (ref 0.7–3.1)
Lymphs: 57 %
MCH: 28.6 pg (ref 26.6–33.0)
MCHC: 33 g/dL (ref 31.5–35.7)
MCV: 87 fL (ref 79–97)
Monocytes Absolute: 0.2 10*3/uL (ref 0.1–0.9)
Monocytes: 12 %
Neutrophils Absolute: 0.5 10*3/uL — ABNORMAL LOW (ref 1.4–7.0)
Neutrophils: 28 %
Phosphorus: 4.1 mg/dL (ref 3.0–4.3)
Platelets: 220 10*3/uL (ref 150–450)
Potassium: 4.4 mmol/L (ref 3.5–5.2)
RBC: 4.51 x10E6/uL (ref 3.77–5.28)
RDW: 12.7 % (ref 11.7–15.4)
Sodium: 142 mmol/L (ref 134–144)
T3 Uptake Ratio: 23 % — ABNORMAL LOW (ref 24–39)
T4, Total: 6 ug/dL (ref 4.5–12.0)
TSH: 1.52 u[IU]/mL (ref 0.450–4.500)
Total Protein: 7.1 g/dL (ref 6.0–8.5)
Triglycerides: 45 mg/dL (ref 0–149)
Uric Acid: 4 mg/dL (ref 3.0–7.2)
VLDL Cholesterol Cal: 9 mg/dL (ref 5–40)
WBC: 1.7 10*3/uL — CL (ref 3.4–10.8)

## 2019-12-31 ENCOUNTER — Ambulatory Visit: Payer: 59 | Attending: Internal Medicine

## 2019-12-31 DIAGNOSIS — Z23 Encounter for immunization: Secondary | ICD-10-CM

## 2019-12-31 NOTE — Progress Notes (Signed)
   Covid-19 Vaccination Clinic  Name:  Jenna Ross    MRN: [id number] DOB: [birth year]  12/31/2019  Jenna Ross was observed post Covid-19 immunization for 15 minutes without incident. She was provided with Vaccine Information Sheet and instruction to access the V-Safe system.   Jenna Ross was instructed to call 911 with any severe reactions post vaccine: Marland Kitchen Difficulty breathing  . Swelling of face and throat  . A fast heartbeat  . A bad rash all over body  . Dizziness and weakness   Immunizations Administered    Name Date Dose VIS Date Route   Pfizer COVID-19 Vaccine 12/31/2019  3:33 PM 0.3 mL 10/06/2019 Intramuscular   Manufacturer: [place]   Lot: [id number]   [place]: [id number]

## 2020-01-02 LAB — URINE CULTURE

## 2020-01-04 ENCOUNTER — Ambulatory Visit: Payer: Self-pay | Admitting: Physician Assistant

## 2020-01-04 ENCOUNTER — Other Ambulatory Visit: Payer: Self-pay

## 2020-01-04 ENCOUNTER — Encounter: Payer: Self-pay | Admitting: Physician Assistant

## 2020-01-04 VITALS — BP 107/77 | HR 68 | Temp 98.4°F | Ht 61.5 in | Wt 125.4 lb

## 2020-01-04 DIAGNOSIS — Z0000 Encounter for general adult medical examination without abnormal findings: Secondary | ICD-10-CM

## 2020-01-04 DIAGNOSIS — R350 Frequency of micturition: Secondary | ICD-10-CM

## 2020-01-04 MED ORDER — SULFAMETHOXAZOLE-TRIMETHOPRIM 800-160 MG PO TABS: 1.0000 | ORAL_TABLET | Freq: Two times a day (BID) | ORAL | 0 refills | Status: DC

## 2020-01-04 NOTE — Progress Notes (Signed)
   Subjective: Annual physical    Patient ID: Jenna Ross, female    DOB: 05-24-1961, 59 y.o.   MRN: [id number]  HPI Patient presents with annual physical voices no concerns or complaints.   Review of Systems Negative    Objective:   Physical Exam No acute distress.  HEENT is unremarkable.  Neck is supple for adenopathy or bruits.  Lungs are clear to auscultation.  Heart regular rate and rhythm.  Abdomen with negative HSM, normoactive bowel sounds, soft nontender palpation.  No obvious deformities to the upper or lower extremities.  Patient is full equal range of motion upper and lower extremities.  No obvious deformity to the cervical lumbar spine.  Patient full equal range of motion cervical lumbar spine.  Patient nontender palpation of spinal processes.  Cranial nerves II through XII grossly intact.       Assessment & Plan: Well exam  Discussed lab results with patient chronic leukocytosis.  Discussed patient urine culture and will prescribe Bactrim DS.  Advised to follow-up status post 10 days repeat urine culture.

## 2020-01-24 ENCOUNTER — Ambulatory Visit: Payer: 59 | Attending: Internal Medicine

## 2020-01-24 DIAGNOSIS — Z23 Encounter for immunization: Secondary | ICD-10-CM

## 2020-01-24 NOTE — Progress Notes (Signed)
   Covid-19 Vaccination Clinic  Name:  Jenna Ross    MRN: [id number] DOB: 1961-04-28  01/24/2020  Jenna Ross was observed post Covid-19 immunization for 15 minutes without incident. She was provided with Vaccine Information Sheet and instruction to access the V-Safe system.   Jenna Ross was instructed to call 911 with any severe reactions post vaccine: Marland Kitchen Difficulty breathing  . Swelling of face and throat  . A fast heartbeat  . A bad rash all over body  . Dizziness and weakness   Immunizations Administered    Name Date Dose VIS Date Route   Pfizer COVID-19 Vaccine 01/24/2020  2:07 PM 0.3 mL 10/06/2019 Intramuscular   Manufacturer: [place]   Lot: [id number]   [place]: [id number]

## 2020-02-27 ENCOUNTER — Other Ambulatory Visit: Payer: Self-pay | Admitting: Physician Assistant

## 2020-02-27 DIAGNOSIS — R21 Rash and other nonspecific skin eruption: Secondary | ICD-10-CM

## 2020-03-22 ENCOUNTER — Other Ambulatory Visit: Payer: Self-pay

## 2020-03-22 DIAGNOSIS — R21 Rash and other nonspecific skin eruption: Secondary | ICD-10-CM

## 2020-03-22 MED ORDER — HYDROXYZINE HCL 25 MG PO TABS: 25.0000 mg | ORAL_TABLET | Freq: Every evening | ORAL | 1 refills | Status: DC | PRN

## 2020-05-09 ENCOUNTER — Other Ambulatory Visit: Payer: Self-pay | Admitting: Emergency Medicine

## 2020-05-09 DIAGNOSIS — Z1231 Encounter for screening mammogram for malignant neoplasm of breast: Secondary | ICD-10-CM

## 2020-05-14 DIAGNOSIS — H5213 Myopia, bilateral: Secondary | ICD-10-CM | POA: Diagnosis not present

## 2020-05-29 ENCOUNTER — Ambulatory Visit
Admission: RE | Admit: 2020-05-29 | Discharge: 2020-05-29 | Disposition: A | Discharge status: Home / Self Care | Payer: 59 | Source: Ambulatory Visit | Attending: Emergency Medicine | Admitting: Emergency Medicine

## 2020-05-29 ENCOUNTER — Other Ambulatory Visit: Payer: Self-pay

## 2020-05-29 DIAGNOSIS — Z1231 Encounter for screening mammogram for malignant neoplasm of breast: Secondary | ICD-10-CM | POA: Diagnosis not present

## 2020-05-31 ENCOUNTER — Other Ambulatory Visit: Payer: Self-pay | Admitting: Emergency Medicine

## 2020-05-31 DIAGNOSIS — R928 Other abnormal and inconclusive findings on diagnostic imaging of breast: Secondary | ICD-10-CM

## 2020-05-31 DIAGNOSIS — N6320 Unspecified lump in the left breast, unspecified quadrant: Secondary | ICD-10-CM

## 2020-06-12 ENCOUNTER — Other Ambulatory Visit: Payer: Self-pay

## 2020-06-12 ENCOUNTER — Ambulatory Visit
Admission: RE | Admit: 2020-06-12 | Discharge: 2020-06-12 | Disposition: A | Discharge status: Home / Self Care | Payer: 59 | Source: Ambulatory Visit | Attending: Emergency Medicine | Admitting: Emergency Medicine

## 2020-06-12 DIAGNOSIS — R928 Other abnormal and inconclusive findings on diagnostic imaging of breast: Secondary | ICD-10-CM

## 2020-06-12 DIAGNOSIS — R922 Inconclusive mammogram: Secondary | ICD-10-CM | POA: Diagnosis not present

## 2020-06-12 DIAGNOSIS — N6320 Unspecified lump in the left breast, unspecified quadrant: Secondary | ICD-10-CM

## 2020-09-11 ENCOUNTER — Other Ambulatory Visit: Payer: Self-pay | Admitting: Physician Assistant

## 2020-09-11 DIAGNOSIS — R21 Rash and other nonspecific skin eruption: Secondary | ICD-10-CM

## 2020-10-04 ENCOUNTER — Other Ambulatory Visit: Payer: Self-pay

## 2020-10-04 NOTE — Telephone Encounter (Signed)
Rx refill request already in Epic - re-routed to Ron Smith, PA-C.  AMD 

## 2020-10-08 ENCOUNTER — Other Ambulatory Visit: Payer: Self-pay | Admitting: Nurse Practitioner

## 2020-10-08 DIAGNOSIS — R21 Rash and other nonspecific skin eruption: Secondary | ICD-10-CM

## 2020-10-08 MED ORDER — HYDROXYZINE HCL 25 MG PO TABS: 25.0000 mg | ORAL_TABLET | Freq: Every evening | ORAL | 1 refills | Status: DC | PRN

## 2020-10-08 NOTE — Progress Notes (Signed)
Refill for Hydroxyzine did not transmit on 10/04/20- resent.   Spoke with patient she uses this as needed in the evenings for itching   Tolerates well without SEs

## 2020-12-16 DIAGNOSIS — M4692 Unspecified inflammatory spondylopathy, cervical region: Secondary | ICD-10-CM | POA: Diagnosis not present

## 2020-12-16 DIAGNOSIS — M13852 Other specified arthritis, left hip: Secondary | ICD-10-CM | POA: Diagnosis not present

## 2021-03-12 NOTE — Progress Notes (Signed)
Scheduled to complete physical 03/18/21 with Hope Neese, FNP.  AMD 

## 2021-03-13 ENCOUNTER — Ambulatory Visit: Payer: Self-pay

## 2021-03-13 ENCOUNTER — Other Ambulatory Visit: Payer: Self-pay

## 2021-03-13 DIAGNOSIS — Z0000 Encounter for general adult medical examination without abnormal findings: Secondary | ICD-10-CM

## 2021-03-13 LAB — POCT URINALYSIS DIPSTICK
Bilirubin, UA: NEGATIVE
Blood, UA: NEGATIVE
Glucose, UA: NEGATIVE
Ketones, UA: NEGATIVE
Leukocytes, UA: NEGATIVE
Nitrite, UA: NEGATIVE
Protein, UA: NEGATIVE
Spec Grav, UA: 1.025 (ref 1.010–1.025)
Urobilinogen, UA: 0.2 E.U./dL
pH, UA: 5.5 (ref 5.0–8.0)

## 2021-03-14 LAB — CMP12+LP+TP+TSH+6AC+CBC/D/PLT
ALT: 27 IU/L (ref 0–32)
AST: 29 IU/L (ref 0–40)
Albumin/Globulin Ratio: 1.9 (ref 1.2–2.2)
Albumin: 4.8 g/dL (ref 3.8–4.9)
Alkaline Phosphatase: 90 IU/L (ref 44–121)
BUN/Creatinine Ratio: 12 (ref 9–23)
BUN: 12 mg/dL (ref 6–24)
Basophils Absolute: 0 10*3/uL (ref 0.0–0.2)
Basos: 1 %
Bilirubin Total: 0.7 mg/dL (ref 0.0–1.2)
Calcium: 9.8 mg/dL (ref 8.7–10.2)
Chloride: 98 mmol/L (ref 96–106)
Chol/HDL Ratio: 2.1 ratio (ref 0.0–4.4)
Cholesterol, Total: 236 mg/dL — ABNORMAL HIGH (ref 100–199)
Creatinine, Ser: 0.97 mg/dL (ref 0.57–1.00)
EOS (ABSOLUTE): 0.1 10*3/uL (ref 0.0–0.4)
Eos: 2 %
Estimated CHD Risk: <0.5 times avg. (ref 0.0–1.0)
Free Thyroxine Index: 1.4 (ref 1.2–4.9)
GGT: 21 IU/L (ref 0–60)
Globulin, Total: 2.5 g/dL (ref 1.5–4.5)
Glucose: 71 mg/dL (ref 65–99)
HDL: 115 mg/dL (ref >39)
Hematocrit: 40.9 % (ref 34.0–46.6)
Hemoglobin: 13.2 g/dL (ref 11.1–15.9)
Immature Grans (Abs): 0 10*3/uL (ref 0.0–0.1)
Immature Granulocytes: 0 %
Iron: 113 ug/dL (ref 27–159)
LDH: 202 IU/L (ref 119–226)
LDL Chol Calc (NIH): 111 mg/dL — ABNORMAL HIGH (ref 0–99)
Lymphocytes Absolute: 1.3 10*3/uL (ref 0.7–3.1)
Lymphs: 48 %
MCH: 28.1 pg (ref 26.6–33.0)
MCHC: 32.3 g/dL (ref 31.5–35.7)
MCV: 87 fL (ref 79–97)
Monocytes Absolute: 0.4 10*3/uL (ref 0.1–0.9)
Monocytes: 15 %
Neutrophils Absolute: 1 10*3/uL — ABNORMAL LOW (ref 1.4–7.0)
Neutrophils: 34 %
Phosphorus: 3.9 mg/dL (ref 3.0–4.3)
Platelets: 237 10*3/uL (ref 150–450)
Potassium: 4.3 mmol/L (ref 3.5–5.2)
RBC: 4.69 x10E6/uL (ref 3.77–5.28)
RDW: 12.5 % (ref 11.7–15.4)
Sodium: 139 mmol/L (ref 134–144)
T3 Uptake Ratio: 22 % — ABNORMAL LOW (ref 24–39)
T4, Total: 6.2 ug/dL (ref 4.5–12.0)
TSH: 1.66 u[IU]/mL (ref 0.450–4.500)
Total Protein: 7.3 g/dL (ref 6.0–8.5)
Triglycerides: 61 mg/dL (ref 0–149)
Uric Acid: 3.9 mg/dL (ref 3.0–7.2)
VLDL Cholesterol Cal: 10 mg/dL (ref 5–40)
WBC: 2.8 10*3/uL — ABNORMAL LOW (ref 3.4–10.8)
eGFR: 68 mL/min/{1.73_m2} (ref >59)

## 2021-03-18 ENCOUNTER — Other Ambulatory Visit: Payer: Self-pay

## 2021-03-18 ENCOUNTER — Encounter: Payer: Self-pay | Admitting: Nurse Practitioner

## 2021-03-18 ENCOUNTER — Ambulatory Visit: Payer: Self-pay | Admitting: Nurse Practitioner

## 2021-03-18 VITALS — BP 143/84 | HR 61 | Temp 97.6°F | Resp 14 | Ht 61.0 in | Wt 135.0 lb

## 2021-03-18 DIAGNOSIS — Z0000 Encounter for general adult medical examination without abnormal findings: Secondary | ICD-10-CM

## 2021-03-18 DIAGNOSIS — F4321 Adjustment disorder with depressed mood: Secondary | ICD-10-CM

## 2021-03-18 NOTE — Patient Instructions (Addendum)
Health Maintenance, Female Adopting a healthy lifestyle and getting preventive care are important in promoting health and wellness. Ask your health care provider about:  The right schedule for you to have regular tests and exams.  Things you can do on your own to prevent diseases and keep yourself healthy. What should I know about diet, weight, and exercise? Eat a healthy diet  Eat a diet that includes plenty of vegetables, fruits, low-fat dairy products, and lean protein.  Do not eat a lot of foods that are high in solid fats, added sugars, or sodium.   Maintain a healthy weight Body mass index (BMI) is used to identify weight problems. It estimates body fat based on height and weight. Your health care provider can help determine your BMI and help you achieve or maintain a healthy weight. Get regular exercise Get regular exercise. This is one of the most important things you can do for your health. Most adults should:  Exercise for at least 150 minutes each week. The exercise should increase your heart rate and make you sweat (moderate-intensity exercise).  Do strengthening exercises at least twice a week. This is in addition to the moderate-intensity exercise.  Spend less time sitting. Even light physical activity can be beneficial. Watch cholesterol and blood lipids Have your blood tested for lipids and cholesterol at 58 years of age, then have this test every 5 years. Have your cholesterol levels checked more often if:  Your lipid or cholesterol levels are high.  You are older than 58 years of age.  You are at high risk for heart disease. What should I know about cancer screening? Depending on your health history and family history, you may need to have cancer screening at various ages. This may include screening for:  Breast cancer.  Cervical cancer.  Colorectal cancer.  Skin cancer.  Lung cancer. What should I know about heart disease, diabetes, and high blood  pressure? Blood pressure and heart disease  High blood pressure causes heart disease and increases the risk of stroke. This is more likely to develop in people who have high blood pressure readings, are of African descent, or are overweight.  Have your blood pressure checked: ? Every 3-5 years if you are 59-59 years of age. ? Every year if you are 63 years old or older. Diabetes Have regular diabetes screenings. This checks your fasting blood sugar level. Have the screening done:  Once every three years after age 58 if you are at a normal weight and have a low risk for diabetes.  More often and at a younger age if you are overweight or have a high risk for diabetes. What should I know about preventing infection? Hepatitis B If you have a higher risk for hepatitis B, you should be screened for this virus. Talk with your health care provider to find out if you are at risk for hepatitis B infection. Hepatitis C Testing is recommended for:  Everyone born from [birth year] through 1963.  Anyone with known risk factors for hepatitis C. Sexually transmitted infections (STIs)  Get screened for STIs, including gonorrhea and chlamydia, if: ? You are sexually active and are younger than 58 years of age. ? You are older than 58 years of age and your health care provider tells you that you are at risk for this type of infection. ? Your sexual activity has changed since you were last screened, and you are at increased risk for chlamydia or gonorrhea. Ask your health care provider  if you are at risk.  Ask your health care provider about whether you are at high risk for HIV. Your health care provider may recommend a prescription medicine to help prevent HIV infection. If you choose to take medicine to prevent HIV, you should first get tested for HIV. You should then be tested every 3 months for as long as you are taking the medicine. Pregnancy  If you are about to stop having your period (premenopausal) and  you may become pregnant, seek counseling before you get pregnant.  Take 400 to 800 micrograms (mcg) of folic acid every day if you become pregnant.  Ask for birth control (contraception) if you want to prevent pregnancy. Osteoporosis and menopause Osteoporosis is a disease in which the bones lose minerals and strength with aging. This can result in bone fractures. If you are 83 years old or older, or if you are at risk for osteoporosis and fractures, ask your health care provider if you should:  Be screened for bone loss.  Take a calcium or vitamin D supplement to lower your risk of fractures.  Be given hormone replacement therapy (HRT) to treat symptoms of menopause. Follow these instructions at home: Lifestyle  Do not use any products that contain nicotine or tobacco, such as cigarettes, e-cigarettes, and chewing tobacco. If you need help quitting, ask your health care provider.  Do not use street drugs.  Do not share needles.  Ask your health care provider for help if you need support or information about quitting drugs. Alcohol use  Do not drink alcohol if: ? Your health care provider tells you not to drink. ? You are pregnant, may be pregnant, or are planning to become pregnant.  If you drink alcohol: ? Limit how much you use to 0-1 drink a day. ? Limit intake if you are breastfeeding.  Be aware of how much alcohol is in your drink. In the [place] one drink equals one 12 oz bottle of beer (355 mL), one 5 oz glass of wine (148 mL), or one 1 oz glass of hard liquor (44 mL). General instructions  Schedule regular health, dental, and eye exams.  Stay current with your vaccines.  Tell your health care provider if: ? You often feel depressed. ? You have ever been abused or do not feel safe at home. Summary  Adopting a healthy lifestyle and getting preventive care are important in promoting health and wellness.  Follow your health care provider's instructions about healthy  diet, exercising, and getting tested or screened for diseases.  Follow your health care provider's instructions on monitoring your cholesterol and blood pressure. This information is not intended to replace advice given to you by your health care provider. Make sure you discuss any questions you have with your health care provider. Document Revised: 10/05/2018 Document Reviewed: 10/05/2018 Elsevier Patient Education  2021 [employer].  Immunization Schedule, 12-10 Years Old  Vaccines are usually given at various ages, according to a schedule. Your health care provider will recommend vaccines for you based on your age, medical history, and lifestyle or other factors such as travel or where you work. You may receive vaccines as individual doses or as more than one vaccine together in one shot (combination vaccines). Talk with your health care provider about the risks and benefits of combination vaccines. Recommended immunizations for 65-62 years old Influenza vaccine  You should get a dose of the influenza vaccine every year. Tetanus, diphtheria, and pertussis vaccine A vaccine that protects against  tetanus, diphtheria, and pertussis is known as the Tdap vaccine. A vaccine that protects against tetanus and diphtheria is known as the Td vaccine.  You should only get the Td vaccine if you have had at least 1 dose of the Tdap vaccine.  You should get 1 dose of the Td or Tdap vaccine every 10 years, or you should get 1 dose of the Tdap vaccine if: ? You have not previously gotten a Tdap vaccine. ? You do not know if you have ever gotten a Tdap vaccine. Zoster vaccine This is also known as the RZV vaccine. You should get 2 doses of the RZV vaccine 2 to 6 months apart. It is important to get the RZV vaccine even if you:  Have had shingles.  Have received the ZVL vaccine, an older version of the RZV vaccine.  Are unsure if you have had chickenpox (varicella). Pneumococcal conjugate  vaccine This is also known as the PCV13 vaccine. You should get the PCV13 vaccine as recommended if you have certain high-risk conditions. These include:  Diabetes.  Chronic conditions of the heart, lungs, or liver.  Conditions that affect the body's disease-fighting system (immune system). Pneumococcal polysaccharide vaccine This is also known as the PPSV23 vaccine. You should get the PPSV23 vaccine as recommended if you have certain high-risk conditions. These include:  Diabetes.  Chronic conditions of the heart, lungs, or liver.  Conditions that affect the immune system. Hepatitis A vaccine This is also known as the HepA vaccine. If you did not get the HepA vaccine previously, you should get it if:  You are at risk for a hepatitis A infection. You may be at risk for infection if you: ? Have chronic liver disease. ? Have HIV or AIDS. ? Are a man who has sex with men. ? Use drugs. ? Are homeless. ? May be exposed to hepatitis A through work. ? Travel to countries where hepatitis A is common. ? Have or will have close contact with someone who was adopted from another country.  You are not at risk for infection but want protection from hepatitis A. Hepatitis B vaccine This is also known as the HepB vaccine. If you did not get the HepB vaccine previously, you should get it if:  You are at risk for hepatitis B infection. You are at risk if you: ? Have chronic liver disease. ? Have HIV or AIDS. ? Have sex with a partner who has hepatitis B, or:  You have multiple sex partners.  You are a man who has sex with men. ? Use drugs. ? May be exposed to hepatitis B through work. ? Live with someone who has hepatitis B. ? Receive dialysis treatment. ? Have diabetes. ? Travel to countries where hepatitis B is common.  You are not at risk of infection but want protection from hepatitis B. Measles, mumps, and rubella vaccine This is also known as the MMR vaccine. You may need to get  the MMR vaccine if you were born in 1957 or later and:  You need to catch up on doses you missed in the past.  You have not been given the vaccine before.  You do not have evidence of immunity (by a blood test). Varicella vaccine This is also known as the VAR vaccine. You may need to get the VAR vaccine if you do not have evidence of immunity (by a blood test) and you may be exposed to varicella through work. This is especially important if you  work in a health care setting. Meningococcal conjugate vaccine This is also known as the MenACWY vaccine. You may need to get the MenACWY vaccine if you:  Have not been given the vaccine before.  Need to catch up on doses you missed in the past. This vaccine is especially important if you:  Do not have a spleen.  Have sickle cell disease.  Have HIV.  Take medicines that suppress your immune system.  Travel to countries where meningococcal disease is common.  Are exposed to Neisseria meningitidis at work. Serogroup B meningococcal vaccine This is also known as the MenB vaccine. You may need to get the MenB vaccine if you:  Have not been given the vaccine before.  Need to catch up on doses you missed in the past. This vaccine is especially important if you:  Do not have a spleen.  Have sickle cell disease.  Take medicines that suppress your immune system.  Are exposed to Neisseria meningitidis at work. Haemophilus influenzae type b vaccine This is also known as the Hib vaccine. Anyone older than 58 years of age is usually not given the Hib vaccine. However, if you have certain high-risk conditions, you may need to get this vaccine. These conditions include:  Not having a spleen.  Having received a stem cell transplant. Before you get a vaccine: Talk with your health care provider about which vaccines are right for you. This is especially important if:  You previously had a reaction after getting a vaccine.  You have a  weakened immune system. You may have a weakened immune system if you: ? Are taking medicines that reduce (suppress) the activity of your immune system. ? Are taking medicines to treat cancer (chemotherapy). ? Have HIV or AIDS.  You work in an environment where you may be exposed to a disease.  You plan to travel outside of the country.  You have a chronic illness, such as heart disease, kidney disease, diabetes, or lung disease. Summary  Before you get a vaccine, tell your health care provider if you have reacted to vaccines in the past or have a condition that weakens your immune system.  At 50-64 years, you should get a dose of the flu vaccine every year and a dose of the Td or Tdap vaccine every 10 years.  You should get 2 doses of the RZV vaccine 2 to 6 months apart.  Depending on your medical history and your risk factors, you may need other vaccines. Ask your health care provider whether you are up to date on all your vaccines. This information is not intended to replace advice given to you by your health care provider. Make sure you discuss any questions you have with your health care provider. Document Revised: 08/08/2019 Document Reviewed: 08/08/2019 Elsevier Patient Education  2021 [place].  Managing Loss, Adult People experience loss in many different ways throughout their lives. Events such as moving, changing jobs, and losing friends can create a sense of loss. The loss may be as serious as a major health change, divorce, death of a pet, or death of a loved one. All of these types of loss are likely to create a physical and emotional reaction known as grief. Grief is the result of a major change or an absence of something or someone that you count on. Grief is a normal reaction to loss. A variety of factors can affect your grieving experience, including:  The nature of your loss.  Your relationship to what  or whom you lost.  Your understanding of grief and how to  manage it.  Your support system. How to manage lifestyle changes Keep to your normal routine as much as possible.  If you have trouble focusing or doing normal activities, it is acceptable to take some time away from your normal routine.  Spend time with friends and loved ones.  Eat a healthy diet, get plenty of sleep, and rest when you feel tired.   How to recognize changes  The way that you deal with your grief will affect your ability to function as you normally do. When grieving, you may experience these changes:  Numbness, shock, sadness, anxiety, anger, denial, and guilt.  Thoughts about death.  Unexpected crying.  A physical sensation of emptiness in your stomach.  Problems sleeping and eating.  Tiredness (fatigue).  Loss of interest in normal activities.  Dreaming about or imagining seeing the person who died.  A need to remember what or whom you lost.  Difficulty thinking about anything other than your loss for a period of time.  Relief. If you have been expecting the loss for a while, you may feel a sense of relief when it happens. Follow these instructions at home: Activity Express your feelings in healthy ways, such as:  Talking with others about your loss. It may be helpful to find others who have had a similar loss, such as a support group.  Writing down your feelings in a journal.  Doing physical activities to release stress and emotional energy.  Doing creative activities like painting, sculpting, or playing or listening to music.  Practicing resilience. This is the ability to recover and adjust after facing challenges. Reading some resources that encourage resilience may help you to learn ways to practice those behaviors.   General instructions  Be patient with yourself and others. Allow the grieving process to happen, and remember that grieving takes time. ? It is likely that you may never feel completely done with some grief. You may find a way to  move on while still cherishing memories and feelings about your loss. ? Accepting your loss is a process. It can take months or longer to adjust.  Keep all follow-up visits as told by your health care provider. This is important. Where to find support To get support for managing loss:  Ask your health care provider for help and recommendations, such as grief counseling or therapy.  Think about joining a support group for people who are managing a loss. Where to find more information You can find more information about managing loss from:  American Society of Clinical Oncology: www.cancer.net  American Psychological Association: TVStereos.ch Contact a health care provider if:  Your grief is extreme and keeps getting worse.  You have ongoing grief that does not improve.  Your body shows symptoms of grief, such as illness.  You feel depressed, anxious, or lonely. Get help right away if:  You have thoughts about hurting yourself or others. If you ever feel like you may hurt yourself or others, or have thoughts about taking your own life, get help right away. You can go to your nearest emergency department or call:  Your local emergency services (911 in the [place]  A suicide crisis helpline, such as the [place] at 7046782639. This is open 24 hours a day. Summary  Grief is the result of a major change or an absence of someone or something that you count on. Grief is a normal  reaction to loss.  The depth of grief and the period of recovery depend on the type of loss and your ability to adjust to the change and process your feelings.  Processing grief requires patience and a willingness to accept your feelings and talk about your loss with people who are supportive.  It is important to find resources that work for you and to realize that people experience grief differently. There is not one grieving process that works for everyone in the same  way.  Be aware that when grief becomes extreme, it can lead to more severe issues like isolation, depression, anxiety, or suicidal thoughts. Talk with your health care provider if you have any of these issues. This information is not intended to replace advice given to you by your health care provider. Make sure you discuss any questions you have with your health care provider. Document Revised: 04/04/2020 Document Reviewed: 04/04/2020 Elsevier Patient Education  [place].

## 2021-03-18 NOTE — Progress Notes (Signed)
Subjective:    Patient ID: Jenna Ross, female    DOB: August 21, 1961, 60 y.o.   MRN: [id number]  HPI Jenna Ross is a 60 y.o. female who presents to the [facility] for her annual exam. 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 She has been at [employer] for 2 years but in her current department for only a few months. She reports that this past year has been tough for her due to loss of her mother and her new job. In addition, her husband has started a new job. Patient reports that she has been stress eating and not exercising as she was used to doing in the past. She has been to counseling after the loss of her mother.    Past Medical History:  Diagnosis Date  . No known health problems    Past Surgical History:  Procedure Laterality Date  . BREAST CYST ASPIRATION Right 2018   neg  . CESAREAN SECTION  1986  . SHOULDER ARTHROSCOPY WITH ROTATOR CUFF REPAIR  03/2014   SH: lives with husband and feels safe, former smoker, no ETOH or drug use Immunization: UTD, has had Covid vaccine   Review of Systems  Constitutional: Positive for fatigue and unexpected weight change (weight gain).  All other systems reviewed and are negative.      Objective:   Physical Exam Vitals and nursing note reviewed.  Constitutional:      General: She is not in acute distress.    Appearance: Normal appearance. She is well-groomed and normal weight.  HENT:     Head: Normocephalic.     Jaw: There is normal jaw occlusion.     Right Ear: Tympanic membrane, ear canal and external ear normal.     Left Ear: Tympanic membrane, ear canal and external ear normal.     Nose: Nose normal.     Mouth/Throat:     Lips: Pink.     Mouth: Mucous membranes are moist.     Dentition: No gum lesions.     Pharynx: Oropharynx is clear.  Eyes:     General: Lids are normal.     Extraocular Movements:     Right eye: Normal extraocular motion and no nystagmus.     Left eye: Normal extraocular motion and no  nystagmus.     Conjunctiva/sclera: Conjunctivae normal.     Pupils: Pupils are equal, round, and reactive to light.     Funduscopic exam:    Right eye: Red reflex present.        Left eye: Red reflex present. Neck:     Thyroid: No thyroid mass or thyroid tenderness.     Vascular: Normal carotid pulses. No carotid bruit or JVD.     Trachea: Trachea normal.  Cardiovascular:     Rate and Rhythm: Normal rate and regular rhythm.     Pulses:          Carotid pulses are 2+ on the right side and 2+ on the left side.      Radial pulses are 2+ on the right side and 2+ on the left side.       Dorsalis pedis pulses are 2+ on the right side and 2+ on the left side.     Heart sounds: No murmur heard.   Pulmonary:     Effort: Pulmonary effort is normal.     Breath sounds: Normal breath sounds and air entry.  Abdominal:     General: Bowel  sounds are normal.     Palpations: Abdomen is soft.     Tenderness: There is no abdominal tenderness. There is no right CVA tenderness or left CVA tenderness.  Musculoskeletal:     Cervical back: Normal range of motion. No rigidity. No pain with movement, spinous process tenderness or muscular tenderness.     Lumbar back: Negative right straight leg raise test and negative left straight leg raise test.     Right lower leg: No edema.     Left lower leg: No edema.  Lymphadenopathy:     Cervical: No cervical adenopathy.  Skin:    General: Skin is warm and dry.  Neurological:     Mental Status: She is alert.     Cranial Nerves: No cranial nerve deficit.     Motor: Motor function is intact.     Coordination: Romberg sign negative. Finger-Nose-Finger Test and Heel to [place] Test normal.     Gait: Gait is intact.     Deep Tendon Reflexes: Reflexes normal.     Comments: Ambulatory with steady gait, stands on one foot without difficulty. Grips are equal.  Psychiatric:        Attention and Perception: Attention normal.        Mood and Affect: Mood normal.         Speech: Speech normal.        Behavior: Behavior normal.       Assessment & Plan:  1. Annual physical exam  2. Unresolved grief Discussed with the patient clinical and lab findings. Patient given the opportunity to ask questions. All questions fully answered and patient voices understanding. Plan for patient to return to a healthy diet and exercise plan and RTC in 6 months to repeat lipid and thyroid panel and determine if treatment is indicated.

## 2021-03-30 ENCOUNTER — Other Ambulatory Visit: Payer: Self-pay | Admitting: Nurse Practitioner

## 2021-03-30 DIAGNOSIS — R21 Rash and other nonspecific skin eruption: Secondary | ICD-10-CM

## 2021-04-02 ENCOUNTER — Other Ambulatory Visit: Payer: Self-pay

## 2021-04-02 DIAGNOSIS — R21 Rash and other nonspecific skin eruption: Secondary | ICD-10-CM

## 2021-04-03 MED ORDER — HYDROXYZINE HCL 25 MG PO TABS: 25.0000 mg | ORAL_TABLET | Freq: Every evening | ORAL | 2 refills | Status: DC | PRN

## 2021-08-19 ENCOUNTER — Other Ambulatory Visit (HOSPITAL_COMMUNITY)
Admission: RE | Admit: 2021-08-19 | Discharge: 2021-08-19 | Disposition: A | Discharge status: Home / Self Care | Payer: 59 | Source: Ambulatory Visit | Attending: Obstetrics and Gynecology | Admitting: Obstetrics and Gynecology

## 2021-08-19 ENCOUNTER — Telehealth: Payer: Self-pay | Admitting: Obstetrics and Gynecology

## 2021-08-19 ENCOUNTER — Other Ambulatory Visit: Payer: Self-pay

## 2021-08-19 ENCOUNTER — Ambulatory Visit (INDEPENDENT_AMBULATORY_CARE_PROVIDER_SITE_OTHER): Payer: BC Managed Care – PPO | Admitting: Obstetrics and Gynecology

## 2021-08-19 ENCOUNTER — Encounter: Payer: Self-pay | Admitting: Obstetrics and Gynecology

## 2021-08-19 VITALS — BP 123/83 | HR 84 | Resp 16 | Ht 61.0 in | Wt 152.2 lb

## 2021-08-19 DIAGNOSIS — Z124 Encounter for screening for malignant neoplasm of cervix: Secondary | ICD-10-CM

## 2021-08-19 DIAGNOSIS — Z1211 Encounter for screening for malignant neoplasm of colon: Secondary | ICD-10-CM

## 2021-08-19 DIAGNOSIS — R635 Abnormal weight gain: Secondary | ICD-10-CM | POA: Diagnosis not present

## 2021-08-19 DIAGNOSIS — Z1231 Encounter for screening mammogram for malignant neoplasm of breast: Secondary | ICD-10-CM | POA: Diagnosis not present

## 2021-08-19 DIAGNOSIS — Z01419 Encounter for gynecological examination (general) (routine) without abnormal findings: Secondary | ICD-10-CM | POA: Diagnosis not present

## 2021-08-19 NOTE — Addendum Note (Signed)
Addended by: Chilton Greathouse on: 08/19/2021 04:49 PM   Modules accepted: Orders

## 2021-08-19 NOTE — Telephone Encounter (Signed)
Patient would like a referral for a colonoscopy and states she forgot to ask Dr. Amalia Hailey at her appointment today.

## 2021-08-19 NOTE — Progress Notes (Signed)
HPI:      [name] is a 60 y.o. G1P1001 who LMP was Patient's last menstrual period was 10/22/2017 (exact date).  Subjective:   She presents today for her annual examination.  She states that she would like to establish care and that she needs a Pap smear.  She also complains of a 40 pound weight gain in the last year.  She attributes this to multiple factors including onset of menopause, a loss in the family which caused her to eat more, a new job where snacks are prevalent, a lack of exercise because she is unable to take her spin class as previously and her discontinuation of a food journal. She has begun to make strides toward once again resuming many of these activities as she is upset regarding this weight gain. She is up-to-date on her blood work and mammography.    Hx: The following portions of the patient's history were reviewed and updated as appropriate:             She  has a past medical history of No known health problems. She does not have any pertinent problems on file. She  has a past surgical history that includes Shoulder arthroscopy with rotator cuff repair (03/2014); Cesarean section (1986); and Breast cyst aspiration (Right, 2018). Her family history includes Diabetes in her maternal grandmother and mother; Heart disease in her father and mother; Hypertension in her father and mother; Leukemia in her maternal grandfather. She  reports that she has quit smoking. She has a 30.00 pack-year smoking history. She has never used smokeless tobacco. She reports that she does not drink alcohol and does not use drugs. She has a current medication list which includes the following prescription(s): hydroxyzine. She has No Known Allergies.       Review of Systems:  Review of Systems  Constitutional: Denied constitutional symptoms, night sweats, recent illness, fatigue, fever, insomnia and weight loss.  Eyes: Denied eye symptoms, eye pain, photophobia, vision change and  visual disturbance.  Ears/Nose/Throat/Neck: Denied ear, nose, throat or neck symptoms, hearing loss, nasal discharge, sinus congestion and sore throat.  Cardiovascular: Denied cardiovascular symptoms, arrhythmia, chest pain/pressure, edema, exercise intolerance, orthopnea and palpitations.  Respiratory: Denied pulmonary symptoms, asthma, pleuritic pain, productive sputum, cough, dyspnea and wheezing.  Gastrointestinal: Denied, gastro-esophageal reflux, melena, nausea and vomiting.  Genitourinary: Denied genitourinary symptoms including symptomatic vaginal discharge, pelvic relaxation issues, and urinary complaints.  Musculoskeletal: Denied musculoskeletal symptoms, stiffness, swelling, muscle weakness and myalgia.  Dermatologic: Denied dermatology symptoms, rash and scar.  Neurologic: Denied neurology symptoms, dizziness, headache, neck pain and syncope.  Psychiatric: Denied psychiatric symptoms, anxiety and depression.  Endocrine: Denied endocrine symptoms including hot flashes and night sweats.   Meds:   Current Outpatient Medications on File Prior to Visit  Medication Sig Dispense Refill   hydrOXYzine (ATARAX/VISTARIL) 25 MG tablet Take 1 tablet (25 mg total) by mouth at bedtime as needed for itching. 90 tablet 2   No current facility-administered medications on file prior to visit.     Objective:     Vitals:   08/19/21 1430  BP: 123/83  Pulse: 84  Resp: 16    Filed Weights   08/19/21 1430  Weight: 152 lb 3.2 oz (69 kg)     Physical examination General NAD, Conversant  HEENT Atraumatic; Op clear with mmm.  Normo-cephalic. Pupils reactive. Anicteric sclerae  Thyroid/Neck Smooth without nodularity or enlargement. Normal ROM.  Neck Supple.  Skin No rashes, lesions or ulceration.  Normal palpated skin turgor. No nodularity.  Breasts: No masses or discharge.  Symmetric.  No axillary adenopathy.  Lungs: Clear to auscultation.No rales or wheezes. Normal Respiratory effort, no  retractions.  Heart: NSR.  No murmurs or rubs appreciated. No periferal edema  Abdomen: Soft.  Non-tender.  No masses.  No HSM. No hernia  Extremities: Moves all appropriately.  Normal ROM for age. No lymphadenopathy.  Neuro: Oriented to PPT.  Normal mood. Normal affect.     Pelvic:   Vulva: Normal appearance.  No lesions.  Vagina: No lesions or abnormalities noted.  Support: Normal pelvic support.  Urethra No masses tenderness or scarring.  Meatus Normal size without lesions or prolapse.  Cervix: Normal appearance.  No lesions.  Anus: Normal exam.  No lesions.  Perineum: Normal exam.  No lesions.        Bimanual   Uterus: Normal size.  Non-tender.  Mobile.  AV.  Adnexae: No masses.  Non-tender to palpation.  Cul-de-sac: Negative for abnormality.     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 Well woman exam with routine gynecological exam   2. Cervical cancer screening   3. Weight gain   4. Encounter for screening mammogram for malignant neoplasm of breast     Please see HPI regarding weight gain   Plan:            1.  Basic Screening Recommendations The basic screening recommendations for asymptomatic women were discussed with the patient during her visit.  The age-appropriate recommendations were discussed with her and the rational for the tests reviewed.  When I am informed by the patient that another primary care physician has previously obtained the age-appropriate tests and they are up-to-date, only outstanding tests are ordered and referrals given as necessary.  Abnormal results of tests will be discussed with her when all of her results are completed.  Routine preventative health maintenance measures emphasized: Exercise/Diet/Weight control, Tobacco Warnings, Alcohol/Substance use risks and Stress Management Pap performed 2.  Weight gain discussed  in detail.  Patient will resume previous habits and expect slow gradual weight loss.   Orders Orders Placed This Encounter  Procedures   MM 3D SCREEN BREAST BILATERAL    No orders of the defined types were placed in this encounter.        F/U  No follow-ups on file.  Finis Bud, M.D. 08/19/2021 3:17 PM

## 2021-08-21 ENCOUNTER — Other Ambulatory Visit: Payer: Self-pay

## 2021-08-21 DIAGNOSIS — Z1211 Encounter for screening for malignant neoplasm of colon: Secondary | ICD-10-CM

## 2021-08-21 LAB — CYTOLOGY - PAP
Comment: NEGATIVE
Diagnosis: NEGATIVE
High risk HPV: NEGATIVE

## 2021-08-21 MED ORDER — NA SULFATE-K SULFATE-MG SULF 17.5-3.13-1.6 GM/177ML PO SOLN: 1.0000 | Freq: Once | ORAL | 0 refills | Status: AC

## 2021-08-21 NOTE — Progress Notes (Signed)
Gastroenterology Pre-Procedure Review  Request Date: 09/24/21 Requesting Physician: Dr. Bonna Gains  PATIENT REVIEW QUESTIONS: The patient responded to the following health history questions as indicated:    1. Are you having any GI issues? no 2. Do you have a personal history of Polyps? no 3. Do you have a family history of Colon Cancer or Polyps? yes (Mother- polyps; Brother- polyps) 4. Diabetes Mellitus? no 5. Joint replacements in the past 12 months?no 6. Major health problems in the past 3 months?no 7. Any artificial heart valves, MVP, or defibrillator?no    MEDICATIONS & ALLERGIES:    Patient reports the following regarding taking any anticoagulation/antiplatelet therapy:   Plavix, Coumadin, Eliquis, Xarelto, Lovenox, Pradaxa, Brilinta, or Effient? no Aspirin? no  Patient confirms/reports the following medications:  Current Outpatient Medications  Medication Sig Dispense Refill   hydrOXYzine (ATARAX/VISTARIL) 25 MG tablet Take 1 tablet (25 mg total) by mouth at bedtime as needed for itching. 90 tablet 2   No current facility-administered medications for this visit.    Patient confirms/reports the following allergies:  No Known Allergies  No orders of the defined types were placed in this encounter.   AUTHORIZATION INFORMATION Primary Insurance: 1D#: Group #:  Secondary Insurance: 1D#: Group #:  SCHEDULE INFORMATION: Date: 09/24/21 Time: Location: [place]

## 2021-08-29 ENCOUNTER — Ambulatory Visit: Payer: Self-pay

## 2021-08-29 ENCOUNTER — Other Ambulatory Visit: Payer: Self-pay

## 2021-08-29 DIAGNOSIS — Z23 Encounter for immunization: Secondary | ICD-10-CM

## 2021-09-09 NOTE — Progress Notes (Signed)
6 months to repeat lipid and thyroid panel and determine if treatment is indicated per [facility] Neese,FNP pt will be following up [facility]

## 2021-09-11 ENCOUNTER — Other Ambulatory Visit: Payer: Self-pay

## 2021-09-11 DIAGNOSIS — E039 Hypothyroidism, unspecified: Secondary | ICD-10-CM

## 2021-09-11 DIAGNOSIS — E7800 Pure hypercholesterolemia, unspecified: Secondary | ICD-10-CM

## 2021-09-12 LAB — LIPID PANEL
Chol/HDL Ratio: 2.4 ratio (ref 0.0–4.4)
Cholesterol, Total: 197 mg/dL (ref 100–199)
HDL: 83 mg/dL (ref >39)
LDL Chol Calc (NIH): 104 mg/dL — ABNORMAL HIGH (ref 0–99)
Triglycerides: 55 mg/dL (ref 0–149)
VLDL Cholesterol Cal: 10 mg/dL (ref 5–40)

## 2021-09-12 LAB — THYROID PANEL WITH TSH
Free Thyroxine Index: 1.5 (ref 1.2–4.9)
T3 Uptake Ratio: 22 % — ABNORMAL LOW (ref 24–39)
T4, Total: 6.9 ug/dL (ref 4.5–12.0)
TSH: 1.46 u[IU]/mL (ref 0.450–4.500)

## 2021-09-16 ENCOUNTER — Encounter: Payer: Self-pay | Admitting: Physician Assistant

## 2021-09-16 ENCOUNTER — Ambulatory Visit: Payer: Self-pay | Admitting: Physician Assistant

## 2021-09-16 ENCOUNTER — Other Ambulatory Visit: Payer: Self-pay

## 2021-09-16 VITALS — BP 120/74 | HR 55 | Temp 98.2°F | Resp 12 | Ht 61.5 in | Wt 140.0 lb

## 2021-09-16 DIAGNOSIS — E782 Mixed hyperlipidemia: Secondary | ICD-10-CM

## 2021-09-16 NOTE — Progress Notes (Signed)
Pt presents today for lab results./CL,RMA

## 2021-09-16 NOTE — Progress Notes (Signed)
   Subjective: Hyperlipidemia    Patient ID: Jenna Ross, female    DOB: [birth year], 60 y.o.   MRN: [id number]  HPI Patient is here for 70-month follow-up for hyperlipidemia.  Patient elected to do diet and exercise history medications.  She wished to review her lab results.   Review of Systems Negative septal complaint    Objective:   Physical Exam No acute distress.  Temperature 98.2, pulse 55, respiration 12, BP 120/74, patient 100% O2 sat on room air.  Patient was 140 pounds. Cholesterol initially 236 decreased to 197, triglycerides initially 61 decreased to 55, and HDL initially 39 increased to 83.        Assessment & Plan: Hyperlipidemia.   Patient hyperlipidemia spine unremarkable to diet and exercise.  No medications indicated at this time.  Patient will follow-up for annual physical exam.

## 2021-09-17 ENCOUNTER — Ambulatory Visit: Payer: 59 | Admitting: Physician Assistant

## 2021-09-24 ENCOUNTER — Ambulatory Visit: Payer: BC Managed Care – PPO | Admitting: Anesthesiology

## 2021-09-24 ENCOUNTER — Encounter
Admission: RE | Disposition: A | Discharge status: Home / Self Care | Payer: Self-pay | Source: Home / Self Care | Attending: Gastroenterology

## 2021-09-24 ENCOUNTER — Ambulatory Visit
Admission: RE | Admit: 2021-09-24 | Discharge: 2021-09-24 | Disposition: A | Discharge status: Home / Self Care | Payer: BC Managed Care – PPO | Attending: Gastroenterology | Admitting: Gastroenterology

## 2021-09-24 DIAGNOSIS — D125 Benign neoplasm of sigmoid colon: Secondary | ICD-10-CM | POA: Insufficient documentation

## 2021-09-24 DIAGNOSIS — D123 Benign neoplasm of transverse colon: Secondary | ICD-10-CM | POA: Insufficient documentation

## 2021-09-24 DIAGNOSIS — K648 Other hemorrhoids: Secondary | ICD-10-CM | POA: Diagnosis not present

## 2021-09-24 DIAGNOSIS — Z87891 Personal history of nicotine dependence: Secondary | ICD-10-CM | POA: Insufficient documentation

## 2021-09-24 DIAGNOSIS — Z1211 Encounter for screening for malignant neoplasm of colon: Secondary | ICD-10-CM | POA: Insufficient documentation

## 2021-09-24 DIAGNOSIS — K635 Polyp of colon: Secondary | ICD-10-CM | POA: Diagnosis not present

## 2021-09-24 DIAGNOSIS — K649 Unspecified hemorrhoids: Secondary | ICD-10-CM | POA: Diagnosis not present

## 2021-09-24 HISTORY — PX: COLONOSCOPY WITH PROPOFOL: SHX5780

## 2021-09-24 SURGERY — COLONOSCOPY WITH PROPOFOL
Anesthesia: General

## 2021-09-24 MED ORDER — MIDAZOLAM HCL 2 MG/2ML IJ SOLN
INTRAMUSCULAR | Status: AC
  Filled 2021-09-24: qty 2

## 2021-09-24 MED ORDER — PROPOFOL 500 MG/50ML IV EMUL
INTRAVENOUS | Status: DC | PRN
  Administered 2021-09-24: 120 ug/kg/min via INTRAVENOUS

## 2021-09-24 MED ORDER — LIDOCAINE 2% (20 MG/ML) 5 ML SYRINGE
INTRAMUSCULAR | Status: DC | PRN
  Administered 2021-09-24: 20 mg via INTRAVENOUS

## 2021-09-24 MED ORDER — PROPOFOL 10 MG/ML IV BOLUS
INTRAVENOUS | Status: DC | PRN
  Administered 2021-09-24: 70 mg via INTRAVENOUS

## 2021-09-24 MED ORDER — SODIUM CHLORIDE 0.9 % IV SOLN
INTRAVENOUS | Status: DC
  Administered 2021-09-24: 20 mL/h via INTRAVENOUS

## 2021-09-24 MED ORDER — MIDAZOLAM HCL 5 MG/5ML IJ SOLN
INTRAMUSCULAR | Status: DC | PRN
  Administered 2021-09-24: 2 mg via INTRAVENOUS

## 2021-09-24 NOTE — Op Note (Signed)
[facility] Gastroenterology Patient Name: Jenna Ross Procedure Date: 09/24/2021 8:45 AM MRN: [id number] Account #: [id number] Date of Birth: 03/31/1961 Admit Type: Outpatient Age: 60 Room: [facility] ENDO ROOM 3 Gender: Female Note Status: Finalized Instrument Name: Park Meo [id number] Procedure:             Colonoscopy Indications:           Screening for colorectal malignant neoplasm Providers:             Creedence Heiss B. Bonna Gains MD, MD Referring MD:          Forest Gleason Md, MD (Referring MD) Medicines:             Monitored Anesthesia Care Complications:         No immediate complications. Procedure:             Pre-Anesthesia Assessment:                        - ASA Grade Assessment: II - A patient with mild                         systemic disease.                        - Prior to the procedure, a History and Physical was                         performed, and patient medications, allergies and                         sensitivities were reviewed. The patient's tolerance                         of previous anesthesia was reviewed.                        - The risks and benefits of the procedure and the                         sedation options and risks were discussed with the                         patient. All questions were answered and informed                         consent was obtained.                        - Patient identification and proposed procedure were                         verified prior to the procedure by the physician, the                         nurse, the anesthesiologist, the anesthetist and the                         technician. The procedure was verified in the  procedure room.                        After obtaining informed consent, the colonoscope was                         passed under direct vision. Throughout the procedure,                         the patient's blood pressure, pulse, and oxygen                          saturations were monitored continuously. The                         Colonoscope was introduced through the anus and                         advanced to the the cecum, identified by the                         appendiceal orifice. The colonoscopy was performed                         with ease. The patient tolerated the procedure well.                         The quality of the bowel preparation was good. Findings:      The perianal and digital rectal examinations were normal.      A 4 mm polyp was found in the transverse colon. The polyp was sessile.       The polyp was removed with a cold snare. Resection and retrieval were       complete.      A 6 mm polyp was found in the sigmoid colon. The polyp was flat. The       polyp was removed with a cold snare. Resection and retrieval were       complete.      The exam was otherwise without abnormality.      The rectum, sigmoid colon, descending colon, transverse colon, ascending       colon and cecum appeared normal.      Non-bleeding internal hemorrhoids were found during retroflexion. Impression:            - One 4 mm polyp in the transverse colon, removed with                         a cold snare. Resected and retrieved.                        - One 6 mm polyp in the sigmoid colon, removed with a                         cold snare. Resected and retrieved.                        - The examination was otherwise normal.                        -  The rectum, sigmoid colon, descending colon,                         transverse colon, ascending colon and cecum are normal.                        - Non-bleeding internal hemorrhoids. Recommendation:        - Discharge patient to home (with escort).                        - Advance diet as tolerated.                        - Continue present medications.                        - Await pathology results.                        - Repeat colonoscopy date to be determined after                          pending pathology results are reviewed.                        - The findings and recommendations were discussed with                         the patient.                        - The findings and recommendations were discussed with                         the patient's family.                        - Return to primary care physician as previously                         scheduled.                        - High fiber diet. Procedure Code(s):     --- Professional ---                        [phone], Colonoscopy, flexible; with removal of                         tumor(s), polyp(s), or other lesion(s) by snare                         technique Diagnosis Code(s):     --- Professional ---                        Z12.11, Encounter for screening for malignant neoplasm                         of colon  K63.5, Polyp of colon CPT copyright 2019 American Medical Association. All rights reserved. The codes documented in this report are preliminary and upon coder review may  be revised to meet current compliance requirements.  Vonda Antigua, MD Margretta Sidle B. Bonna Gains MD, MD 09/24/2021 9:26:10 AM This report has been signed electronically. Number of Addenda: 0 Note Initiated On: 09/24/2021 8:45 AM Scope Withdrawal Time: 0 hours 12 minutes 42 seconds  Total Procedure Duration: 0 hours 16 minutes 44 seconds  Estimated Blood Loss:  Estimated blood loss: none.      [facility]

## 2021-09-24 NOTE — Transfer of Care (Signed)
Immediate Anesthesia Transfer of Care Note  Patient: [name]  Procedure(s) Performed: COLONOSCOPY WITH PROPOFOL  Patient Location: Endoscopy Unit  Anesthesia Type:General  Level of Consciousness: awake  Airway & Oxygen Therapy: Patient Spontanous Breathing  Post-op Assessment: Report given to RN and Post -op Vital signs reviewed and stable  Post vital signs: Reviewed  Last Vitals:  Vitals Value Taken Time  BP 97/68 09/24/21 0916  Temp 36.2 C 09/24/21 0915  Pulse 73 09/24/21 0916  Resp 21 09/24/21 0916  SpO2 100 % 09/24/21 0916  Vitals shown include unvalidated device data.  Last Pain:  Vitals:   09/24/21 0915  TempSrc: Temporal  PainSc: Asleep         Complications: No notable events documented.

## 2021-09-24 NOTE — Anesthesia Postprocedure Evaluation (Signed)
Anesthesia Post Note  Patient: Jenna Ross  Procedure(s) Performed: COLONOSCOPY WITH PROPOFOL  Patient location during evaluation: PACU Anesthesia Type: General Level of consciousness: awake and awake and alert Pain management: satisfactory to patient Vital Signs Assessment: post-procedure vital signs reviewed and stable Respiratory status: spontaneous breathing Cardiovascular status: blood pressure returned to baseline Anesthetic complications: no   No notable events documented.   Last Vitals:  Vitals:   09/24/21 0925 09/24/21 0935  BP: 106/71 110/78  Pulse: 63 (!) 51  Resp: 18 (!) 8  Temp:    SpO2: 100% 100%    Last Pain:  Vitals:   09/24/21 0935  TempSrc:   PainSc: 0-No pain                 VAN STAVEREN,Saidy Ormand

## 2021-09-24 NOTE — Anesthesia Preprocedure Evaluation (Signed)
Anesthesia Evaluation  Patient identified by MRN, date of birth, ID band Patient awake    Reviewed: Allergy & Precautions, NPO status , Patient's Chart, lab work & pertinent test results  Airway Mallampati: II  TM Distance: >3 FB Neck ROM: full    Dental  (+) Teeth Intact   Pulmonary neg pulmonary ROS, former smoker,    Pulmonary exam normal breath sounds clear to auscultation       Cardiovascular Exercise Tolerance: Good negative cardio ROS Normal cardiovascular exam Rhythm:Regular     Neuro/Psych negative neurological ROS  negative psych ROS   GI/Hepatic negative GI ROS, Neg liver ROS,   Endo/Other  negative endocrine ROS  Renal/GU negative Renal ROS  negative genitourinary   Musculoskeletal negative musculoskeletal ROS (+)   Abdominal Normal abdominal exam  (+)   Peds negative pediatric ROS (+)  Hematology negative hematology ROS (+)   Anesthesia Other Findings Past Medical History: No date: No known health problems  Past Surgical History: 2018: BREAST CYST ASPIRATION; Right     Comment:  neg 1986: CESAREAN SECTION 03/2014: SHOULDER ARTHROSCOPY WITH ROTATOR CUFF REPAIR  BMI    Body Mass Index: 25.65 kg/m      Reproductive/Obstetrics negative OB ROS                             Anesthesia Physical Anesthesia Plan  ASA: 2  Anesthesia Plan: General   Post-op Pain Management:    Induction: Intravenous  PONV Risk Score and Plan: Propofol infusion and TIVA  Airway Management Planned: Nasal Cannula  Additional Equipment:   Intra-op Plan:   Post-operative Plan:   Informed Consent: I have reviewed the patients History and Physical, chart, labs and discussed the procedure including the risks, benefits and alternatives for the proposed anesthesia with the patient or authorized representative who has indicated his/her understanding and acceptance.     Dental Advisory  Given  Plan Discussed with: CRNA and Surgeon  Anesthesia Plan Comments:         Anesthesia Quick Evaluation

## 2021-09-24 NOTE — H&P (Signed)
Jenna Antigua, MD [address] [place], [place], [place], [address] [address], [place], [place], [place], [address] Phone: [phone]  Fax: [phone]  Primary Care Physician:  Harlin Heys, MD   Pre-Procedure History & Physical: HPI:  Jenna Ross is a 60 y.o. female is here for a colonoscopy.   Past Medical History:  Diagnosis Date   No known health problems     Past Surgical History:  Procedure Laterality Date   BREAST CYST ASPIRATION Right 2018   neg   CESAREAN SECTION  1986   SHOULDER ARTHROSCOPY WITH ROTATOR CUFF REPAIR  03/2014    Prior to Admission medications   Medication Sig Start Date End Date Taking? Authorizing Provider  hydrOXYzine (ATARAX/VISTARIL) 25 MG tablet Take 1 tablet (25 mg total) by mouth at bedtime as needed for itching. 04/03/21  Yes Sable Feil, PA-C    Allergies as of 08/21/2021   (No Known Allergies)    Family History  Problem Relation Age of Onset   Hypertension Mother    Diabetes Mother    Heart disease Mother    Hypertension Father    Heart disease Father    Diabetes Maternal Grandmother    Leukemia Maternal Grandfather    Breast cancer Neg Hx     Social History   Socioeconomic History   Marital status: Married    Spouse name: Not on file   Number of children: Not on file   Years of education: Not on file   Highest education level: Not on file  Occupational History   Not on file  Tobacco Use   Smoking status: Former    Packs/day: 1.00    Years: 30.00    Pack years: 30.00    Types: Cigarettes   Smokeless tobacco: Never  Vaping Use   Vaping Use: Never used  Substance and Sexual Activity   Alcohol use: No   Drug use: No   Sexual activity: Yes    Birth control/protection: Post-menopausal  Other Topics Concern   Not on file  Social History Narrative   Not on file   Social Determinants of Health   Financial Resource Strain: Not on file  Food Insecurity: Not on file  Transportation  Needs: Not on file  Physical Activity: Not on file  Stress: Not on file  Social Connections: Not on file  Intimate Partner Violence: Not on file    Review of Systems: See HPI, otherwise negative ROS  Physical Exam: Constitutional: General:   Alert,  Well-developed, well-nourished, pleasant and cooperative in NAD BP 116/74   Pulse 66   Temp (!) 97.5 F (36.4 C) (Temporal)   Resp 20   Ht 5' 1.5" (1.562 m)   Wt 62.6 kg   LMP 10/22/2017 (Exact Date) Comment: perimenopausal  SpO2 100%   BMI 25.65 kg/m   Head: Normocephalic, atraumatic.   Eyes:  Sclera clear, no icterus.   Conjunctiva pink.   Mouth:  No deformity or lesions, oropharynx pink & moist.  Neck:  Supple, trachea midline  Respiratory: Normal respiratory effort  Gastrointestinal:  Soft, non-tender and non-distended without masses, hepatosplenomegaly or hernias noted.  No guarding or rebound tenderness.     Cardiac: No clubbing or edema.  No cyanosis. Normal posterior tibial pedal pulses noted.  Lymphatic:  No significant cervical adenopathy.  Psych:  Alert and cooperative. Normal mood and affect.  Musculoskeletal:   Symmetrical without gross deformities. 5/5 Lower extremity strength bilaterally.  Skin: Warm. Intact without significant lesions  or rashes. No jaundice.  Neurologic:  Face symmetrical, tongue midline, Normal sensation to touch;  grossly normal neurologically.  Psych:  Alert and oriented x3, Alert and cooperative. Normal mood and affect.  Impression/Plan: Judah LADONNA VANORDER is here for a colonoscopy to be performed for average risk screening.  Risks, benefits, limitations, and alternatives regarding  colonoscopy have been reviewed with the patient.  Questions have been answered.  All parties agreeable.   Virgel Manifold, MD  09/24/2021, 8:19 AM

## 2021-09-25 ENCOUNTER — Encounter: Payer: Self-pay | Admitting: Gastroenterology

## 2021-09-25 LAB — SURGICAL PATHOLOGY

## 2021-09-26 ENCOUNTER — Encounter: Payer: Self-pay | Admitting: Gastroenterology

## 2022-01-25 ENCOUNTER — Other Ambulatory Visit: Payer: Self-pay | Admitting: Physician Assistant

## 2022-01-25 DIAGNOSIS — R21 Rash and other nonspecific skin eruption: Secondary | ICD-10-CM

## 2022-02-11 ENCOUNTER — Other Ambulatory Visit: Payer: Self-pay

## 2022-02-11 NOTE — Telephone Encounter (Signed)
Liliauna called [facility] requesting Rx refill for Hydroxyzine 25 mg. ? ?Upon entering Rx refill info into Epic, a box opened up for pended Rx refill request for Hydroxyzine 25 mg sent electronically by patient's pharmacy. ? ?Re-routed the pended Rx refill request for Hydroxyzine 25 mg to Randel Pigg, PA-C. ? ?AMD ?

## 2022-03-19 ENCOUNTER — Ambulatory Visit: Payer: Self-pay

## 2022-03-19 DIAGNOSIS — R82998 Other abnormal findings in urine: Secondary | ICD-10-CM

## 2022-03-19 DIAGNOSIS — Z0000 Encounter for general adult medical examination without abnormal findings: Secondary | ICD-10-CM

## 2022-03-19 LAB — POCT URINALYSIS DIPSTICK
Bilirubin, UA: NEGATIVE
Blood, UA: NEGATIVE
Glucose, UA: NEGATIVE
Ketones, UA: NEGATIVE
Nitrite, UA: NEGATIVE
Protein, UA: NEGATIVE
Spec Grav, UA: 1.02 (ref 1.010–1.025)
Urobilinogen, UA: 0.2 E.U./dL
pH, UA: 6 (ref 5.0–8.0)

## 2022-03-19 NOTE — Addendum Note (Signed)
Addended by: Malachy Moan F on: 03/19/2022 10:16 AM   Modules accepted: Orders

## 2022-03-19 NOTE — Progress Notes (Signed)
Pt presents today for physical labs, will return to clinic for scheduled physical.  

## 2022-03-20 LAB — CMP12+LP+TP+TSH+6AC+CBC/D/PLT
ALT: 15 IU/L (ref 0–32)
AST: 21 IU/L (ref 0–40)
Albumin/Globulin Ratio: 2.1 (ref 1.2–2.2)
Albumin: 4.9 g/dL (ref 3.8–4.9)
Alkaline Phosphatase: 95 IU/L (ref 44–121)
BUN/Creatinine Ratio: 15 (ref 9–23)
BUN: 12 mg/dL (ref 6–24)
Basophils Absolute: 0 10*3/uL (ref 0.0–0.2)
Basos: 1 %
Bilirubin Total: 0.7 mg/dL (ref 0.0–1.2)
Calcium: 9.3 mg/dL (ref 8.7–10.2)
Chloride: 102 mmol/L (ref 96–106)
Chol/HDL Ratio: 2.2 ratio (ref 0.0–4.4)
Cholesterol, Total: 217 mg/dL — ABNORMAL HIGH (ref 100–199)
Creatinine, Ser: 0.8 mg/dL (ref 0.57–1.00)
EOS (ABSOLUTE): 0.1 10*3/uL (ref 0.0–0.4)
Eos: 3 %
Estimated CHD Risk: <0.5 times avg. (ref 0.0–1.0)
Free Thyroxine Index: 1.4 (ref 1.2–4.9)
GGT: 19 IU/L (ref 0–60)
Globulin, Total: 2.3 g/dL (ref 1.5–4.5)
Glucose: 78 mg/dL (ref 70–99)
HDL: 98 mg/dL (ref >39)
Hematocrit: 38.9 % (ref 34.0–46.6)
Hemoglobin: 13.2 g/dL (ref 11.1–15.9)
Immature Grans (Abs): 0 10*3/uL (ref 0.0–0.1)
Immature Granulocytes: 0 %
Iron: 113 ug/dL (ref 27–159)
LDH: 181 IU/L (ref 119–226)
LDL Chol Calc (NIH): 108 mg/dL — ABNORMAL HIGH (ref 0–99)
Lymphocytes Absolute: 1.2 10*3/uL (ref 0.7–3.1)
Lymphs: 48 %
MCH: 28.9 pg (ref 26.6–33.0)
MCHC: 33.9 g/dL (ref 31.5–35.7)
MCV: 85 fL (ref 79–97)
Monocytes Absolute: 0.3 10*3/uL (ref 0.1–0.9)
Monocytes: 13 %
Neutrophils Absolute: 0.8 10*3/uL — ABNORMAL LOW (ref 1.4–7.0)
Neutrophils: 35 %
Phosphorus: 3.9 mg/dL (ref 3.0–4.3)
Platelets: 241 10*3/uL (ref 150–450)
Potassium: 4.4 mmol/L (ref 3.5–5.2)
RBC: 4.56 x10E6/uL (ref 3.77–5.28)
RDW: 12.5 % (ref 11.7–15.4)
Sodium: 141 mmol/L (ref 134–144)
T3 Uptake Ratio: 21 % — ABNORMAL LOW (ref 24–39)
T4, Total: 6.5 ug/dL (ref 4.5–12.0)
TSH: 2.06 u[IU]/mL (ref 0.450–4.500)
Total Protein: 7.2 g/dL (ref 6.0–8.5)
Triglycerides: 60 mg/dL (ref 0–149)
Uric Acid: 3.9 mg/dL (ref 3.0–7.2)
VLDL Cholesterol Cal: 11 mg/dL (ref 5–40)
WBC: 2.4 10*3/uL — CL (ref 3.4–10.8)
eGFR: 85 mL/min/{1.73_m2} (ref >59)

## 2022-03-22 LAB — URINE CULTURE

## 2022-03-25 ENCOUNTER — Ambulatory Visit: Payer: Self-pay | Admitting: Physician Assistant

## 2022-03-25 ENCOUNTER — Encounter: Payer: Self-pay | Admitting: Physician Assistant

## 2022-03-25 VITALS — BP 130/74 | HR 58 | Temp 97.6°F | Resp 12 | Ht 61.5 in | Wt 138.0 lb

## 2022-03-25 DIAGNOSIS — E782 Mixed hyperlipidemia: Secondary | ICD-10-CM

## 2022-03-25 DIAGNOSIS — Z0000 Encounter for general adult medical examination without abnormal findings: Secondary | ICD-10-CM

## 2022-03-25 NOTE — Progress Notes (Signed)
[facility]  ______________________________   None    (approximate)  I have reviewed the triage vital signs and the nursing notes.   HISTORY  Chief Complaint No chief complaint on file.  HPI Jenna Ross is a 61 y.o. female patient presents annual physical exam.  Patient voices no concerns or complaints.  History of leukocytosis.         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 Authorizing Provider  hydrOXYzine (ATARAX) 25 MG tablet Take 1 tablet (25 mg total) by mouth at bedtime as needed for itching. 02/11/22   Sable Feil, PA-C    Allergies Patient has no known allergies.  Family History  Problem Relation Age of Onset   Hypertension Mother    Diabetes Mother    Heart disease Mother    Hypertension Father    Heart disease Father    Diabetes Maternal Grandmother    Leukemia Maternal Grandfather    Breast cancer Neg Hx     Social History Social History   Tobacco Use   Smoking status: Former    Packs/day: 1.00    Years: 30.00    Pack years: 30.00    Types: Cigarettes   Smokeless tobacco: Never  Vaping Use   Vaping Use: Never used  Substance Use Topics   Alcohol use: No   Drug use: No    Review of Systems Constitutional: No  fever/chills Eyes: No visual changes. ENT: No sore throat. Cardiovascular: Denies chest pain. Respiratory: Denies shortness of breath. Gastrointestinal: No abdominal pain.  No nausea, no vomiting.  No diarrhea.  No constipation. Genitourinary: Negative for dysuria. Musculoskeletal: Negative for back pain. Skin: Negative for rash. Neurological: Negative for headaches, focal weakness or numbness.  ____________________________________________   PHYSICAL EXAM:  VITAL SIGNS: Constitutional: Alert and oriented. Well appearing and in no acute distress. Eyes: Conjunctivae are normal. PERRL. EOMI. Head: Atraumatic. Nose: No congestion/rhinnorhea. Mouth/Throat: Mucous membranes are moist.  Oropharynx non-erythematous. Neck: No stridor.  No cervical spine tenderness to palpation. Hematological/Lymphatic/Immunilogical: No cervical lymphadenopathy. Cardiovascular: Normal rate, regular rhythm. Grossly normal heart sounds.  Good peripheral circulation. Respiratory: Normal respiratory effort.  No retractions. Lungs CTAB. Gastrointestinal: Soft and nontender. No distention. No abdominal bruits. No CVA tenderness. Genitourinary: Deferred Musculoskeletal: No lower extremity tenderness nor edema.  No joint effusions. Neurologic:  Normal speech and language. No gross focal neurologic deficits are appreciated. No gait instability. Skin:  Skin is warm, dry and intact. No rash noted. Psychiatric: Mood and affect are normal. Speech and behavior are normal.  ____________________________________________   LABS    Component 6 d ago  Urine Culture, Routine Final report   Organism ID, Bacteria Comment   Comment: Greater than 2  organisms recovered, none predominant. Please submit  another sample if clinically indicated.  Greater than 100,000 colony forming units per mL   Resulting Agency [id number]                 Other Results from 03/19/2022   Contains abnormal data POCT urinalysis  dipstick Order: [id number] Status: Edited Result - FINAL    Visible to patient: Yes (seen)    Next appt: None    Dx: Routine adult health maintenance    0 Result Notes       Component Ref Range & Units 6 d ago 1 yr ago 2 yr ago  Color, UA  yellow  yellow  yellow   Clarity, UA  clear  clear  clear   Glucose, UA Negative Negative  Negative  Negative   Bilirubin, UA  negative  negative  negative   Ketones, UA  negative  negative  negative   Spec Grav, UA 1.010 - 1.025 1.020  1.025  1.025   Blood, UA  negative  negative  negative   pH, UA 5.0 - 8.0 6.0  5.5  5.5   Protein, UA Negative Negative  Negative  Negative   Urobilinogen, UA 0.2 or 1.0 E.U./dL 0.2  0.2  0.2   Nitrite, UA  negative  negative  negative   Leukocytes, UA Negative Large (3+) Abnormal  VC  Negative  Small (1+) Abnormal    Appearance   light     Odor                           Contains critical data CMP12+LP+TP+TSH+6AC+CBC/D/Plt Order: [id number] Status: Final result    Visible to patient: Yes (seen)    Next appt: None    Dx: Routine adult health maintenance    0 Result Notes         Component Ref Range & Units 6 d ago (03/19/22) 6 mo ago (09/11/21) 6 mo ago (09/11/21) 1 yr ago (03/13/21) 2 yr ago (12/28/19)  Glucose 70 - 99 mg/dL 78    71 R  76 R   Uric Acid 3.0 - 7.2 mg/dL 3.9    3.9 CM  4.0 CM   Comment:            Therapeutic target for gout patients: <6.0  BUN 6 - 24 mg/dL _0 Creatinine, Ser 0.57 - 1.00 mg/dL 0.80    0.97  0.91   eGFR >59 mL/min/1.73 85    68    BUN/Creatinine Ratio 9 - _1 Sodium 134 - 144 mmol/L 141    139  142   Potassium 3.5 - 5.2 mmol/L 4.4    4.3  4.4   Chloride 96 - 106 mmol/L 102    98  102   Calcium 8.7 - 10.2 mg/dL 9.3    9.8  9.5   Phosphorus 3.0 - 4.3 mg/dL 3.9    3.9  4.1   Total Protein 6.0 - 8.5 g/dL 7.2    7.3  7.1   Albumin 3.8 - 4.9 g/dL 4.9    4.8  4.6   Globulin, Total 1.5 - 4.5 g/dL 2.3    2.5  2.5   Albumin/Globulin Ratio 1.2 -  2.2 2.1    1.9  1.8   Bilirubin Total 0.0 - 1.2 mg/dL 0.7    0.7  0.8  Alkaline Phosphatase 44 - 121 IU/L 95    90  75 R   LDH 119 - 226 IU/L 181    202  174   AST 0 - 40 IU/L _0 ALT 0 - 32 IU/L _1 GGT 0 - 60 IU/L _2 Iron 27 - 159 ug/dL 113    113  98   Cholesterol, Total 100 - 199 mg/dL 217 High    197  236 High   186   Triglycerides 0 - 149 mg/dL 60   55  61  45   HDL >39 mg/dL 98   83  115  87   VLDL Cholesterol Cal 5 - 40 mg/dL _3 LDL Chol Calc (NIH) 0 - 99 mg/dL 108 High    104 High   111 High   90   Chol/HDL Ratio 0.0 - 4.4 ratio 2.2   2.4 CM  2.1 CM  2.1 CM   Comment:                                   T. Chol/HDL Ratio                                              Men  Women                                1/2 Avg.Risk  3.4    3.3                                    Avg.Risk  5.0    4.4                                 2X Avg.Risk  9.6    7.1                                 3X Avg.Risk 23.4   11.0   Estimated CHD Risk 0.0 - 1.0 times avg.  < 0.5     < 0.5 CM   < 0.5 CM   Comment: The CHD Risk is based on the T. Chol/HDL ratio. Other  factors affect CHD Risk such as hypertension, smoking,  diabetes, severe obesity, and family history of  premature CHD.   TSH 0.450 - 4.500 uIU/mL 2.060  1.460   1.660  1.520   T4, Total 4.5 - 12.0 ug/dL 6.5  6.9   6.2  6.0   T3 Uptake Ratio 24 - 39 % 21 Low   22 Low    22 Low   23 Low    Free Thyroxine Index 1.2 - 4.9 1.4  1.5   1.4  1.4   WBC 3.4 - 10.8 x10E3/uL 2.4 Low Panic     2.8 Low   1.7 Low Panic    RBC 3.77 - 5.28 x10E6/uL 4.56  4.69  4.51   Hemoglobin 11.1 - 15.9 g/dL 13.2    13.2  12.9   Hematocrit 34.0 - 46.6 % 38.9    40.9  39.1   MCV 79 - 97 fL 85    87  87   MCH 26.6 - 33.0 pg 28.9    28.1  28.6   MCHC 31.5 - 35.7 g/dL 33.9    32.3  33.0   RDW 11.7 - 15.4 % 12.5    12.5  12.7   Platelets 150 - 450 x10E3/uL 241    237  220   Neutrophils Not Estab. % 35    34  28   Lymphs Not  Estab. % 48    48  57   Monocytes Not Estab. % _0 Eos Not Estab. % _1 Basos Not Estab. % _2 Neutrophils Absolute 1.4 - 7.0 x10E3/uL 0.8 Low     1.0 Low   0.5 Low    Lymphocytes Absolute 0.7 - 3.1 x10E3/uL 1.2    1.3  1.0   Monocytes Absolute 0.1 - 0.9 x10E3/uL 0.3    0.4  0.2   EOS (ABSOLUTE) 0.0 - 0.4 x10E3/uL 0.1    0.1  0.0   Basophils Absolute 0.0 - 0.2 x10E3/uL 0.0    0.0  0.0   Immature Granulocytes Not Estab. % 0    0  0   Immature Grans            __________________________________________  EKG Sinus  Bradycardia at 57 bpm -  Negative precordial T-waves.   WITHIN NORMAL LIMITS  ____________________________________________    ____________________________________________   INITIAL IMPRESSION / ASSESSMENT AND PLAN  As part of my medical decision making, I reviewed the following data within the [facility]      Discussed lab results and EKG findings with patient.  Patient advised to continue diet exercise to lower cholesterol.      ____________________________________________   FINAL CLINICAL IMPRESSION   ED Discharge Orders     None        Note:  This document was prepared using Dragon voice recognition software and may include unintentional dictation errors.

## 2022-03-25 NOTE — Progress Notes (Signed)
Pt presents today to complete physical. Pt denies any issues or concerns at this time./CL,RMA 

## 2022-04-01 ENCOUNTER — Other Ambulatory Visit: Payer: Self-pay | Admitting: Physician Assistant

## 2022-04-01 DIAGNOSIS — Z1231 Encounter for screening mammogram for malignant neoplasm of breast: Secondary | ICD-10-CM

## 2022-05-06 ENCOUNTER — Ambulatory Visit
Admission: RE | Admit: 2022-05-06 | Discharge: 2022-05-06 | Disposition: A | Discharge status: Home / Self Care | Payer: BC Managed Care – PPO | Source: Ambulatory Visit | Attending: Physician Assistant | Admitting: Physician Assistant

## 2022-05-06 DIAGNOSIS — Z1231 Encounter for screening mammogram for malignant neoplasm of breast: Secondary | ICD-10-CM | POA: Insufficient documentation

## 2022-06-12 DIAGNOSIS — M25562 Pain in left knee: Secondary | ICD-10-CM | POA: Diagnosis not present

## 2022-06-12 DIAGNOSIS — M2392 Unspecified internal derangement of left knee: Secondary | ICD-10-CM | POA: Diagnosis not present

## 2022-08-14 DIAGNOSIS — M1712 Unilateral primary osteoarthritis, left knee: Secondary | ICD-10-CM | POA: Diagnosis not present

## 2022-08-14 DIAGNOSIS — M7542 Impingement syndrome of left shoulder: Secondary | ICD-10-CM | POA: Diagnosis not present

## 2022-10-13 DIAGNOSIS — R21 Rash and other nonspecific skin eruption: Secondary | ICD-10-CM | POA: Diagnosis not present

## 2022-10-13 DIAGNOSIS — L298 Other pruritus: Secondary | ICD-10-CM | POA: Diagnosis not present

## 2023-02-12 DIAGNOSIS — S61011A Laceration without foreign body of right thumb without damage to nail, initial encounter: Secondary | ICD-10-CM | POA: Diagnosis not present

## 2023-02-12 DIAGNOSIS — Z23 Encounter for immunization: Secondary | ICD-10-CM | POA: Diagnosis not present

## 2023-02-19 ENCOUNTER — Ambulatory Visit: Payer: Self-pay

## 2023-02-19 DIAGNOSIS — Z0000 Encounter for general adult medical examination without abnormal findings: Secondary | ICD-10-CM

## 2023-02-19 LAB — POCT URINALYSIS DIPSTICK
Bilirubin, UA: NEGATIVE
Blood, UA: NEGATIVE
Glucose, UA: NEGATIVE
Ketones, UA: NEGATIVE
Nitrite, UA: NEGATIVE
Protein, UA: NEGATIVE
Spec Grav, UA: 1.015 (ref 1.010–1.025)
Urobilinogen, UA: 0.2 E.U./dL
pH, UA: 6 (ref 5.0–8.0)

## 2023-02-23 LAB — CMP12+LP+TP+TSH+6AC+CBC/D/PLT
ALT: 24 IU/L (ref 0–32)
AST: 25 IU/L (ref 0–40)
Albumin/Globulin Ratio: 1.8 (ref 1.2–2.2)
Albumin: 4.6 g/dL (ref 3.8–4.9)
Alkaline Phosphatase: 91 IU/L (ref 44–121)
BUN/Creatinine Ratio: 16 (ref 12–28)
BUN: 13 mg/dL (ref 8–27)
Basophils Absolute: 0 10*3/uL (ref 0.0–0.2)
Basos: 1 %
Bilirubin Total: 0.5 mg/dL (ref 0.0–1.2)
Calcium: 9.5 mg/dL (ref 8.7–10.3)
Chloride: 101 mmol/L (ref 96–106)
Chol/HDL Ratio: 2.2 ratio (ref 0.0–4.4)
Cholesterol, Total: 190 mg/dL (ref 100–199)
Creatinine, Ser: 0.8 mg/dL (ref 0.57–1.00)
EOS (ABSOLUTE): 0.1 10*3/uL (ref 0.0–0.4)
Eos: 5 %
Estimated CHD Risk: <0.5 times avg. (ref 0.0–1.0)
Free Thyroxine Index: 1.8 (ref 1.2–4.9)
GGT: 20 IU/L (ref 0–60)
Globulin, Total: 2.5 g/dL (ref 1.5–4.5)
Glucose: 74 mg/dL (ref 70–99)
HDL: 86 mg/dL (ref >39)
Hematocrit: 37.4 % (ref 34.0–46.6)
Hemoglobin: 12.2 g/dL (ref 11.1–15.9)
Immature Grans (Abs): 0 10*3/uL (ref 0.0–0.1)
Immature Granulocytes: 0 %
Iron: 93 ug/dL (ref 27–159)
LDH: 186 IU/L (ref 119–226)
LDL Chol Calc (NIH): 92 mg/dL (ref 0–99)
Lymphocytes Absolute: 1.5 10*3/uL (ref 0.7–3.1)
Lymphs: 61 %
MCH: 28.1 pg (ref 26.6–33.0)
MCHC: 32.6 g/dL (ref 31.5–35.7)
MCV: 86 fL (ref 79–97)
Monocytes Absolute: 0.3 10*3/uL (ref 0.1–0.9)
Monocytes: 11 %
Neutrophils Absolute: 0.5 10*3/uL — ABNORMAL LOW (ref 1.4–7.0)
Neutrophils: 22 %
Phosphorus: 3.5 mg/dL (ref 3.0–4.3)
Platelets: 263 10*3/uL (ref 150–450)
Potassium: 4.1 mmol/L (ref 3.5–5.2)
RBC: 4.34 x10E6/uL (ref 3.77–5.28)
RDW: 13.8 % (ref 11.7–15.4)
Sodium: 142 mmol/L (ref 134–144)
T3 Uptake Ratio: 23 % — ABNORMAL LOW (ref 24–39)
T4, Total: 7.7 ug/dL (ref 4.5–12.0)
TSH: 1.96 u[IU]/mL (ref 0.450–4.500)
Total Protein: 7.1 g/dL (ref 6.0–8.5)
Triglycerides: 65 mg/dL (ref 0–149)
Uric Acid: 4.4 mg/dL (ref 3.0–7.2)
VLDL Cholesterol Cal: 12 mg/dL (ref 5–40)
WBC: 2.4 10*3/uL — CL (ref 3.4–10.8)
eGFR: 84 mL/min/{1.73_m2} (ref >59)

## 2023-02-25 ENCOUNTER — Encounter: Payer: Self-pay | Admitting: Physician Assistant

## 2023-02-25 ENCOUNTER — Ambulatory Visit: Payer: Self-pay | Admitting: Physician Assistant

## 2023-02-25 VITALS — BP 112/59 | HR 77 | Temp 98.2°F | Resp 14 | Ht 61.0 in | Wt 132.0 lb

## 2023-02-25 DIAGNOSIS — Z0000 Encounter for general adult medical examination without abnormal findings: Secondary | ICD-10-CM

## 2023-02-25 DIAGNOSIS — R82998 Other abnormal findings in urine: Secondary | ICD-10-CM

## 2023-02-25 LAB — POCT URINALYSIS DIPSTICK
Bilirubin, UA: NEGATIVE
Blood, UA: NEGATIVE
Glucose, UA: NEGATIVE
Nitrite, UA: NEGATIVE
Protein, UA: POSITIVE — AB
Spec Grav, UA: 1.025 (ref 1.010–1.025)
Urobilinogen, UA: 0.2 E.U./dL
pH, UA: 6 (ref 5.0–8.0)

## 2023-02-25 NOTE — Progress Notes (Signed)
[facility] ____________________________________________   None    (approximate)  I have reviewed the triage vital signs and the nursing notes.   HISTORY  Chief Complaint Annual Exam   HPI Jenna Ross is a 62 y.o. female patient presents for annual physical exam.  Patient is most concerned for chronic asymptomatic leukocytes in urine for over 3 years.         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 Authorizing Provider  OPZELURA 1.5 % CREA Apply topically. 12/11/22  Yes [provider]  hydrOXYzine (ATARAX) 25 MG tablet Take 1 tablet (25 mg total) by mouth at bedtime as needed for itching. 02/11/22   Joni Reining, PA-C  meloxicam (MOBIC) 15 MG tablet Take 15 mg by mouth daily.    [provider]  Na Sulfate-K Sulfate-Mg Sulf 17.5-3.13-1.6 GM/177ML SOLN See admin instructions.    [provider]    Allergies Patient has no known allergies.  Family History  Problem Relation Age of Onset   Hypertension Mother    Diabetes Mother    Heart disease Mother    Hypertension Father    Heart disease Father    Diabetes Maternal Grandmother    Leukemia Maternal Grandfather    Breast cancer Neg Hx     Social History Social  History   Tobacco Use   Smoking status: Former    Packs/day: 1.00    Years: 30.00    Additional pack years: 0.00    Total pack years: 30.00    Types: Cigarettes   Smokeless tobacco: Never  Vaping Use   Vaping Use: Never used  Substance Use Topics   Alcohol use: No   Drug use: No    Review of Systems Constitutional: No fever/chills Eyes: No visual changes. ENT: No sore throat. Cardiovascular: Denies chest pain. Respiratory: Denies shortness of breath. Gastrointestinal: No abdominal pain.  No nausea, no vomiting.  No diarrhea.  No constipation. Genitourinary: Negative for dysuria. Musculoskeletal: Negative for back pain. Skin: Negative for rash. Neurological: Negative for headaches, focal weakness or numbness. ____________________________________________   PHYSICAL EXAM:  VITAL SIGNS: BP 112/59  Pulse 77  Resp 14  Temp 98.2 F (36.8 C)  Temp src Temporal  SpO2 96 %  Weight 132 lb (59.9 kg)  Height 5\' 1"  (1.549 m)   BMI 24.94 kg/m2  BSA 1.61 m2  Constitutional: Alert and oriented. Well appearing and in no acute distress. Eyes: Conjunctivae are normal. PERRL. EOMI. Head: Atraumatic. Nose: No congestion/rhinnorhea. Mouth/Throat: Mucous membranes are moist.  Oropharynx non-erythematous. Neck: No stridor.  No cervical spine tenderness to palpation. Hematological/Lymphatic/Immunilogical: No cervical lymphadenopathy. Cardiovascular: Normal rate, regular rhythm. Grossly normal heart sounds.  Good peripheral circulation. Respiratory:  Normal respiratory effort.  No retractions. Lungs CTAB. Gastrointestinal: Soft and nontender. No distention. No abdominal bruits. No CVA tenderness. Genitourinary: Deferred Musculoskeletal: No lower extremity tenderness nor edema.  No joint effusions. Neurologic:  Normal speech and language. No gross focal neurologic deficits are appreciated. No gait instability. Skin:  Skin is warm, dry and intact. No rash noted. Psychiatric: Mood and  affect are normal. Speech and behavior are normal.  ____________________________________________   LABS        Component Ref Range & Units 6 d ago 11 mo ago 1 yr ago 3 yr ago  Color, UA Yellow yellow yellow yellow  Clarity, UA Clear clear clear clear  Glucose, UA Negative Negative Negative Negative Negative  Bilirubin, UA Negative negative negative negative  Ketones, UA Negative negative negative negative  Spec Grav, UA 1.010 - 1.025 1.015 1.020 1.025 1.025  Blood, UA Negative negative negative negative  pH, UA 5.0 - 8.0 6.0 6.0 5.5 5.5  Protein, UA Negative Negative Negative Negative Negative  Urobilinogen, UA 0.2 or 1.0 E.U./dL 0.2 0.2 0.2 0.2  Nitrite, UA Negative negative negative negative  Leukocytes, UA Negative Moderate (2+) Abnormal  Large (3+) Abnormal  VC Negative Small (1+) Abnormal   Appearance   light   Odor                   Component Ref Range & Units 6 d ago (02/19/23) 11 mo ago (03/19/22) 1 yr ago (09/11/21) 1 yr ago (09/11/21) 1 yr ago (03/13/21) 3 yr ago (12/28/19)  Glucose 70 - 99 mg/dL 74 78   71 R 76 R  Uric Acid 3.0 - 7.2 mg/dL 4.4 3.9 CM   3.9 CM 4.0 CM  Comment:            Therapeutic target for gout patients: <6.0  BUN 8 - 27 mg/dL 13 12 R   12 R 10 R  Creatinine, Ser 0.57 - 1.00 mg/dL 1.47 8.29   5.62 1.30  eGFR >59 mL/min/1.73 84 85   68   BUN/Creatinine Ratio 12 - 28 16 15  R   12 R 11 R  Sodium 134 - 144 mmol/L 142 141   139 142  Potassium 3.5 - 5.2 mmol/L 4.1 4.4   4.3 4.4  Chloride 96 - 106 mmol/L 101 102   98 102  Calcium 8.7 - 10.3 mg/dL 9.5 9.3 R   9.8 R 9.5 R  Phosphorus 3.0 - 4.3 mg/dL 3.5 3.9   3.9 4.1  Total Protein 6.0 - 8.5 g/dL 7.1 7.2   7.3 7.1  Albumin 3.8 - 4.9 g/dL 4.6 4.9   4.8 4.6  Globulin, Total 1.5 - 4.5 g/dL 2.5 2.3   2.5 2.5  Albumin/Globulin Ratio 1.2 - 2.2 1.8 2.1   1.9 1.8  Bilirubin Total 0.0 - 1.2 mg/dL 0.5 0.7   0.7 0.8  Alkaline Phosphatase 44 - 121 IU/L 91 95   90 75 R  LDH 119 - 226  IU/L 186 181   202 174  AST 0 - 40 IU/L 25 21   29 25   ALT 0 - 32 IU/L 24 15   27 14   GGT 0 - 60 IU/L 20 19   21 14   Iron 27 - 159 ug/dL 93 865   784 98  Cholesterol, Total 100 - 199 mg/dL 696 295 High   284 132 High  186  Triglycerides 0 - 149 mg/dL 65 60  55 61 45  HDL >44 mg/dL  86 98  83 115 87  VLDL Cholesterol Cal 5 - 40 mg/dL 12 11  10 10 9   LDL Chol Calc (NIH) 0 - 99 mg/dL 92 161 High   096 High  111 High  90  Chol/HDL Ratio 0.0 - 4.4 ratio 2.2 2.2 CM  2.4 CM 2.1 CM 2.1 CM  Comment:                                   T. Chol/HDL Ratio                                             Men  Women                               1/2 Avg.Risk  3.4    3.3                                   Avg.Risk  5.0    4.4                                2X Avg.Risk  9.6    7.1                                3X Avg.Risk 23.4   11.0  Estimated CHD Risk 0.0 - 1.0 times avg.  < 0.5  < 0.5 CM    < 0.5 CM  < 0.5 CM  Comment: The CHD Risk is based on the T. Chol/HDL ratio. Other factors affect CHD Risk such as hypertension, smoking, diabetes, severe obesity, and family history of premature CHD.  TSH 0.450 - 4.500 uIU/mL 1.960 2.060 1.460  1.660 1.520  T4, Total 4.5 - 12.0 ug/dL 7.7 6.5 6.9  6.2 6.0  T3 Uptake Ratio 24 - 39 % 23 Low  21 Low  22 Low   22 Low  23 Low   Free Thyroxine Index 1.2 - 4.9 1.8 1.4 1.5  1.4 1.4  WBC 3.4 - 10.8 x10E3/uL 2.4 Low Panic  2.4 Low Panic    2.8 Low  1.7 Low Panic   RBC 3.77 - 5.28 x10E6/uL 4.34 4.56   4.69 4.51  Hemoglobin 11.1 - 15.9 g/dL 04.5 40.9   81.1 91.4  Hematocrit 34.0 - 46.6 % 37.4 38.9   40.9 39.1  MCV 79 - 97 fL 86 85   87 87  MCH 26.6 - 33.0 pg 28.1 28.9   28.1 28.6  MCHC 31.5 - 35.7 g/dL 78.2 95.6   21.3 08.6  RDW 11.7 - 15.4 % 13.8 12.5   12.5 12.7  Platelets 150 - 450 x10E3/uL 263 241   237 220  Neutrophils Not Estab. % 22 35   34 28  Lymphs Not Estab. % 61 48   48 57  Monocytes Not Estab. % 11 13   15 12   Eos Not Estab. % 5 3   2  2   Basos Not Estab. % 1 1   1 1   Neutrophils Absolute 1.4 - 7.0 x10E3/uL 0.5  Low  0.8 Low    1.0 Low  0.5 Low   Lymphocytes Absolute 0.7 - 3.1 x10E3/uL 1.5 1.2   1.3 1.0  Monocytes Absolute 0.1 - 0.9 x10E3/uL 0.3 0.3   0.4 0.2  EOS (ABSOLUTE) 0.0 - 0.4 x10E3/uL 0.1 0.1   0.1 0.0  Basophils Absolute 0.0 - 0.2 x10E3/uL 0.0 0.0   0.0 0.0  Immature Granulocytes Not Estab. % 0 0   0 0  Immature Grans (Abs)             ____________________________________________  EKG  Sinus bradycardia 56 bpm ____________________________________________    ____________________________________________   INITIAL IMPRESSION / ASSESSMENT AND PLAN As part of my medical decision making, I reviewed the following data within the electronic MEDICAL RECORD NUMBER       No acute findings on physical exam and EKG.  Labs continue to show chronic asymptomatic urine leukocytes.  Consult to urologist is warranted.     ____________________________________________   FINAL CLINICAL IMPRESSION Well exam   ED Discharge Orders     None        Note:  This document was prepared using Dragon voice recognition software and may include unintentional dictation errors.

## 2023-02-25 NOTE — Progress Notes (Signed)
Pt presents today to complete physical, Pt concerned of lab work.

## 2023-03-01 LAB — URINE CULTURE

## 2023-03-15 DIAGNOSIS — M1712 Unilateral primary osteoarthritis, left knee: Secondary | ICD-10-CM | POA: Diagnosis not present

## 2023-03-15 DIAGNOSIS — M7542 Impingement syndrome of left shoulder: Secondary | ICD-10-CM | POA: Diagnosis not present

## 2023-04-01 DIAGNOSIS — M25562 Pain in left knee: Secondary | ICD-10-CM | POA: Insufficient documentation

## 2023-04-01 DIAGNOSIS — R600 Localized edema: Secondary | ICD-10-CM | POA: Insufficient documentation

## 2023-04-01 DIAGNOSIS — M25512 Pain in left shoulder: Secondary | ICD-10-CM | POA: Insufficient documentation

## 2023-04-02 DIAGNOSIS — M25562 Pain in left knee: Secondary | ICD-10-CM | POA: Diagnosis not present

## 2023-04-02 DIAGNOSIS — R600 Localized edema: Secondary | ICD-10-CM | POA: Diagnosis not present

## 2023-04-02 DIAGNOSIS — M25512 Pain in left shoulder: Secondary | ICD-10-CM | POA: Diagnosis not present

## 2023-04-08 ENCOUNTER — Other Ambulatory Visit: Payer: Self-pay | Admitting: Physician Assistant

## 2023-04-08 DIAGNOSIS — R21 Rash and other nonspecific skin eruption: Secondary | ICD-10-CM

## 2023-04-14 ENCOUNTER — Other Ambulatory Visit: Payer: Self-pay | Admitting: Physician Assistant

## 2023-04-14 ENCOUNTER — Other Ambulatory Visit: Payer: Self-pay

## 2023-04-14 DIAGNOSIS — R21 Rash and other nonspecific skin eruption: Secondary | ICD-10-CM

## 2023-04-14 DIAGNOSIS — Z1231 Encounter for screening mammogram for malignant neoplasm of breast: Secondary | ICD-10-CM

## 2023-04-14 MED ORDER — HYDROXYZINE HCL 25 MG PO TABS: 25.0000 mg | ORAL_TABLET | Freq: Every evening | ORAL | 3 refills | Status: AC | PRN

## 2023-05-07 DIAGNOSIS — M25562 Pain in left knee: Secondary | ICD-10-CM | POA: Diagnosis not present

## 2023-05-07 DIAGNOSIS — R600 Localized edema: Secondary | ICD-10-CM | POA: Diagnosis not present

## 2023-05-07 DIAGNOSIS — M25512 Pain in left shoulder: Secondary | ICD-10-CM | POA: Diagnosis not present

## 2023-05-10 ENCOUNTER — Ambulatory Visit
Admission: RE | Admit: 2023-05-10 | Discharge: 2023-05-10 | Disposition: A | Discharge status: Home / Self Care | Payer: BC Managed Care – PPO | Source: Ambulatory Visit | Attending: Physician Assistant | Admitting: Physician Assistant

## 2023-05-10 DIAGNOSIS — Z1231 Encounter for screening mammogram for malignant neoplasm of breast: Secondary | ICD-10-CM | POA: Diagnosis present

## 2023-06-23 DIAGNOSIS — M7542 Impingement syndrome of left shoulder: Secondary | ICD-10-CM | POA: Diagnosis not present

## 2023-07-02 DIAGNOSIS — M25512 Pain in left shoulder: Secondary | ICD-10-CM | POA: Diagnosis not present

## 2023-07-26 DIAGNOSIS — M25512 Pain in left shoulder: Secondary | ICD-10-CM | POA: Diagnosis not present

## 2023-08-02 DIAGNOSIS — M25512 Pain in left shoulder: Secondary | ICD-10-CM | POA: Diagnosis not present

## 2023-08-13 DIAGNOSIS — M7542 Impingement syndrome of left shoulder: Secondary | ICD-10-CM | POA: Insufficient documentation

## 2023-08-19 DIAGNOSIS — M25512 Pain in left shoulder: Secondary | ICD-10-CM | POA: Diagnosis not present

## 2023-12-13 DIAGNOSIS — L2089 Other atopic dermatitis: Secondary | ICD-10-CM | POA: Diagnosis not present

## 2024-01-25 ENCOUNTER — Ambulatory Visit (INDEPENDENT_AMBULATORY_CARE_PROVIDER_SITE_OTHER): Admitting: Obstetrics and Gynecology

## 2024-01-25 ENCOUNTER — Encounter: Payer: Self-pay | Admitting: Obstetrics and Gynecology

## 2024-01-25 ENCOUNTER — Other Ambulatory Visit (HOSPITAL_COMMUNITY)
Admission: RE | Admit: 2024-01-25 | Discharge: 2024-01-25 | Disposition: A | Discharge status: Home / Self Care | Source: Ambulatory Visit | Attending: Obstetrics and Gynecology | Admitting: Obstetrics and Gynecology

## 2024-01-25 VITALS — BP 120/70 | HR 68 | Ht 61.0 in | Wt 135.0 lb

## 2024-01-25 DIAGNOSIS — Z1231 Encounter for screening mammogram for malignant neoplasm of breast: Secondary | ICD-10-CM

## 2024-01-25 DIAGNOSIS — Z01419 Encounter for gynecological examination (general) (routine) without abnormal findings: Secondary | ICD-10-CM | POA: Insufficient documentation

## 2024-01-25 DIAGNOSIS — Z124 Encounter for screening for malignant neoplasm of cervix: Secondary | ICD-10-CM

## 2024-01-25 NOTE — Progress Notes (Signed)
 HPI:      Jenna Ross is a 63 y.o. G1P1001 who LMP was Patient's last menstrual period was 10/22/2017 (exact date).  Subjective:   She presents today for her annual examination.  She has no complaints.  She has been having some orthopedic issues including her shoulder knee and a recent fracture of her foot.  She has not been able to exercise as usual reports that she has gained some weight. She has a mammogram scheduled in July.  She plans blood work today.  She is due for her Pap.    Hx: The following portions of the patient's history were reviewed and updated as appropriate:             She  has a past medical history of No known health problems. She does not have any pertinent problems on file. She  has a past surgical history that includes Shoulder arthroscopy with rotator cuff repair (03/2014); Cesarean section (1986); Breast cyst aspiration (Right, 2018); and Colonoscopy with propofol (N/A, 09/24/2021). Her family history includes Diabetes in her maternal grandmother and mother; Heart disease in her father and mother; Hypertension in her father and mother; Leukemia in her maternal grandfather. She  reports that she has quit smoking. Her smoking use included cigarettes. She has a 30 pack-year smoking history. She has never used smokeless tobacco. She reports that she does not drink alcohol and does not use drugs. She has a current medication list which includes the following prescription(s): hydroxyzine, opzelura, and na sulfate-k sulfate-mg sulfate concentrate. She has no known allergies.       Review of Systems:  Review of Systems  Constitutional: Denied constitutional symptoms, night sweats, recent illness, fatigue, fever, insomnia and weight loss.  Eyes: Denied eye symptoms, eye pain, photophobia, vision change and visual disturbance.  Ears/Nose/Throat/Neck: Denied ear, nose, throat or neck symptoms, hearing loss, nasal discharge, sinus congestion and sore throat.   Cardiovascular: Denied cardiovascular symptoms, arrhythmia, chest pain/pressure, edema, exercise intolerance, orthopnea and palpitations.  Respiratory: Denied pulmonary symptoms, asthma, pleuritic pain, productive sputum, cough, dyspnea and wheezing.  Gastrointestinal: Denied, gastro-esophageal reflux, melena, nausea and vomiting.  Genitourinary: Denied genitourinary symptoms including symptomatic vaginal discharge, pelvic relaxation issues, and urinary complaints.  Musculoskeletal: Denied musculoskeletal symptoms, stiffness, swelling, muscle weakness and myalgia.  Dermatologic: Denied dermatology symptoms, rash and scar.  Neurologic: Denied neurology symptoms, dizziness, headache, neck pain and syncope.  Psychiatric: Denied psychiatric symptoms, anxiety and depression.  Endocrine: Denied endocrine symptoms including hot flashes and night sweats.   Meds:   Current Outpatient Medications on File Prior to Visit  Medication Sig Dispense Refill   hydrOXYzine (ATARAX) 25 MG tablet Take 1 tablet (25 mg total) by mouth at bedtime as needed for itching. 90 tablet 3   OPZELURA 1.5 % CREA Apply topically.     Na Sulfate-K Sulfate-Mg Sulf 17.5-3.13-1.6 GM/177ML SOLN See admin instructions. (Patient not taking: Reported on 01/25/2024)     No current facility-administered medications on file prior to visit.     Objective:     Vitals:   01/25/24 0931  BP: 120/70  Pulse: 68    Filed Weights   01/25/24 0931  Weight: 135 lb (61.2 kg)              Physical examination General NAD, Conversant  HEENT Atraumatic; Op clear with mmm.  Normo-cephalic.  Anicteric sclerae  Thyroid/Neck Smooth without nodularity or enlargement. Normal ROM.  Neck Supple.  Skin No rashes, lesions or ulceration. Normal palpated  skin turgor. No nodularity.  Breasts: No masses or discharge.  Symmetric.  No axillary adenopathy.  Lungs: Clear to auscultation.No rales or wheezes. Normal Respiratory effort, no retractions.   Heart: NSR.  No murmurs or rubs appreciated. No peripheral edema  Abdomen: Soft.  Non-tender.  No masses.  No HSM. No hernia  Extremities: Moves all appropriately.  Normal ROM for age. No lymphadenopathy.  Neuro: Oriented to PPT.  Normal mood. Normal affect.     Pelvic:   Vulva: Normal appearance.  No lesions.  Vagina: No lesions or abnormalities noted.  Support: Normal pelvic support.  Urethra No masses tenderness or scarring.  Meatus Normal size without lesions or prolapse.  Cervix: Normal appearance.  No lesions.  Anus: Normal exam.  No lesions.  Perineum: Normal exam.  No lesions.        Bimanual   Uterus: Normal size.  Non-tender.  Mobile.  AV.  Adnexae: No masses.  Non-tender to palpation.  Cul-de-sac: Negative for abnormality.     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 Well woman exam with routine gynecological exam   2. Cervical cancer screening   3. Screening mammogram for breast cancer     Normal exam.     Plan:            1.  Basic Screening Recommendations The basic screening recommendations for asymptomatic women were discussed with the patient during her visit.  The age-appropriate recommendations were discussed with her and the rational for the tests reviewed.  When I am informed by the patient that another primary care physician has previously obtained the age-appropriate tests and they are up-to-date, only outstanding tests are ordered and referrals given as necessary.  Abnormal results of tests will be discussed with her when all of her results are completed.  Routine preventative health maintenance measures emphasized:  Exercise/Diet/Weight control, Tobacco Warnings, Alcohol/Substance use risks and Stress Management Pap performed -recommend DEXA -lab work today -mammogram as scheduled in July   Orders Orders Placed This Encounter  Procedures   DG Bone Density   MM 3D SCREENING MAMMOGRAM BILATERAL BREAST   Basic metabolic panel with GFR   CBC   Cholesterol, total   Hemoglobin A1c   Lipid panel    No orders of the defined types were placed in this encounter.        F/U  Return in about 1 year (around 01/24/2025) for Annual Physical.  Elonda Husky, M.D. 01/25/2024 10:02 AM

## 2024-01-25 NOTE — Progress Notes (Signed)
 Patients presents for annual exam today. She states doing well, recovering from broken foot and shoulder pain. Due for pap smear, ordered. Patient is due for mammogram in July, ordered. Annual labs are ordered. She states no other questions or concerns at this time.

## 2024-01-26 LAB — CBC
Hematocrit: 40.1 % (ref 34.0–46.6)
Hemoglobin: 13.3 g/dL (ref 11.1–15.9)
MCH: 29.4 pg (ref 26.6–33.0)
MCHC: 33.2 g/dL (ref 31.5–35.7)
MCV: 89 fL (ref 79–97)
Platelets: 243 10*3/uL (ref 150–450)
RBC: 4.52 x10E6/uL (ref 3.77–5.28)
RDW: 12.9 % (ref 11.7–15.4)
WBC: 2.7 10*3/uL — ABNORMAL LOW (ref 3.4–10.8)

## 2024-01-26 LAB — HEMOGLOBIN A1C
Est. average glucose Bld gHb Est-mCnc: 111 mg/dL
Hgb A1c MFr Bld: 5.5 % (ref 4.8–5.6)

## 2024-01-26 LAB — BASIC METABOLIC PANEL WITH GFR
BUN/Creatinine Ratio: 13 (ref 12–28)
BUN: 12 mg/dL (ref 8–27)
CO2: 24 mmol/L (ref 20–29)
Calcium: 9.8 mg/dL (ref 8.7–10.3)
Chloride: 102 mmol/L (ref 96–106)
Creatinine, Ser: 0.95 mg/dL (ref 0.57–1.00)
Glucose: 75 mg/dL (ref 70–99)
Potassium: 4.4 mmol/L (ref 3.5–5.2)
Sodium: 141 mmol/L (ref 134–144)
eGFR: 69 mL/min/{1.73_m2} (ref >59)

## 2024-01-26 LAB — LIPID PANEL
Chol/HDL Ratio: 2.2 ratio (ref 0.0–4.4)
Cholesterol, Total: 222 mg/dL — ABNORMAL HIGH (ref 100–199)
HDL: 103 mg/dL (ref >39)
LDL Chol Calc (NIH): 106 mg/dL — ABNORMAL HIGH (ref 0–99)
Triglycerides: 73 mg/dL (ref 0–149)
VLDL Cholesterol Cal: 13 mg/dL (ref 5–40)

## 2024-01-26 LAB — CYTOLOGY - PAP
Adequacy: ABSENT
Comment: NEGATIVE
Diagnosis: NEGATIVE
High risk HPV: NEGATIVE

## 2024-02-21 ENCOUNTER — Encounter

## 2024-02-24 ENCOUNTER — Encounter: Admitting: Physician Assistant

## 2024-03-02 DIAGNOSIS — S92355D Nondisplaced fracture of fifth metatarsal bone, left foot, subsequent encounter for fracture with routine healing: Secondary | ICD-10-CM | POA: Insufficient documentation

## 2024-07-20 ENCOUNTER — Ambulatory Visit
Admission: RE | Admit: 2024-07-20 | Discharge: 2024-07-20 | Disposition: A | Discharge status: Home / Self Care | Source: Ambulatory Visit | Attending: Obstetrics and Gynecology | Admitting: Obstetrics and Gynecology

## 2024-07-20 DIAGNOSIS — Z1231 Encounter for screening mammogram for malignant neoplasm of breast: Secondary | ICD-10-CM | POA: Diagnosis present

## 2024-07-20 DIAGNOSIS — Z01419 Encounter for gynecological examination (general) (routine) without abnormal findings: Secondary | ICD-10-CM

## 2024-07-21 ENCOUNTER — Other Ambulatory Visit: Payer: Self-pay

## 2024-07-24 ENCOUNTER — Other Ambulatory Visit: Payer: Self-pay | Admitting: Physician Assistant

## 2024-07-24 DIAGNOSIS — R928 Other abnormal and inconclusive findings on diagnostic imaging of breast: Secondary | ICD-10-CM

## 2024-07-26 ENCOUNTER — Ambulatory Visit: Payer: Self-pay | Admitting: Physician Assistant

## 2024-07-27 ENCOUNTER — Other Ambulatory Visit: Payer: Self-pay | Admitting: Physician Assistant

## 2024-07-27 ENCOUNTER — Ambulatory Visit
Admission: RE | Admit: 2024-07-27 | Discharge: 2024-07-27 | Disposition: A | Discharge status: Home / Self Care | Source: Ambulatory Visit | Attending: Physician Assistant | Admitting: Physician Assistant

## 2024-07-27 DIAGNOSIS — R928 Other abnormal and inconclusive findings on diagnostic imaging of breast: Secondary | ICD-10-CM

## 2024-07-27 DIAGNOSIS — N6489 Other specified disorders of breast: Secondary | ICD-10-CM

## 2024-07-27 DIAGNOSIS — N6012 Diffuse cystic mastopathy of left breast: Secondary | ICD-10-CM | POA: Diagnosis not present

## 2024-07-27 HISTORY — PX: BREAST BIOPSY: SHX20

## 2024-07-27 MED ORDER — LIDOCAINE-EPINEPHRINE 1 %-1:100000 IJ SOLN
20.0000 mL | Freq: Once | INTRAMUSCULAR | Status: AC
Start: 2024-07-27 — End: 2024-07-27
  Administered 2024-07-27: 20 mL
  Filled 2024-07-27: qty 20

## 2024-07-27 MED ORDER — LIDOCAINE 1 % OPTIME INJ - NO CHARGE
5.0000 mL | Freq: Once | INTRAMUSCULAR | Status: AC
Start: 2024-07-27 — End: 2024-07-27
  Administered 2024-07-27: 5 mL
  Filled 2024-07-27: qty 6

## 2024-07-28 LAB — SURGICAL PATHOLOGY

## 2024-08-24 ENCOUNTER — Ambulatory Visit

## 2024-08-24 DIAGNOSIS — Z23 Encounter for immunization: Secondary | ICD-10-CM
